# Patient Record
Sex: Male | Born: 1965 | Race: White | Hispanic: No | Smoking: Never smoker
Health system: Southern US, Community
[De-identification: ages and names within clinical notes are randomized; demographics above are authoritative.]

## PROBLEM LIST (undated history)

## (undated) DIAGNOSIS — N200 Calculus of kidney: Secondary | ICD-10-CM

## (undated) DIAGNOSIS — G4730 Sleep apnea, unspecified: Secondary | ICD-10-CM

## (undated) DIAGNOSIS — H9193 Unspecified hearing loss, bilateral: Secondary | ICD-10-CM

## (undated) HISTORY — DX: Unspecified hearing loss, bilateral: H91.93

---
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
## 2004-01-07 ENCOUNTER — Other Ambulatory Visit: Payer: Self-pay

## 2004-12-22 HISTORY — PX: CHOLECYSTECTOMY: SHX55

## 2005-02-05 ENCOUNTER — Ambulatory Visit: Payer: Self-pay | Admitting: General Surgery

## 2005-05-05 ENCOUNTER — Emergency Department: Payer: Self-pay | Admitting: General Surgery

## 2005-05-05 ENCOUNTER — Other Ambulatory Visit: Payer: Self-pay

## 2005-05-06 ENCOUNTER — Ambulatory Visit: Payer: Self-pay | Admitting: General Surgery

## 2005-08-03 ENCOUNTER — Emergency Department: Payer: Self-pay | Admitting: Emergency Medicine

## 2005-08-04 ENCOUNTER — Inpatient Hospital Stay: Payer: Self-pay | Admitting: Internal Medicine

## 2005-08-04 ENCOUNTER — Other Ambulatory Visit: Payer: Self-pay

## 2007-05-25 ENCOUNTER — Emergency Department: Payer: Self-pay | Admitting: Emergency Medicine

## 2010-04-29 ENCOUNTER — Ambulatory Visit: Payer: Self-pay | Admitting: General Practice

## 2015-09-28 ENCOUNTER — Telehealth: Payer: Self-pay | Admitting: Physician Assistant

## 2015-09-28 ENCOUNTER — Encounter: Payer: Self-pay | Admitting: Physician Assistant

## 2015-09-28 ENCOUNTER — Ambulatory Visit: Payer: Self-pay | Admitting: Physician Assistant

## 2015-09-28 VITALS — BP 130/80 | HR 55 | Temp 97.9°F | Wt 389.0 lb

## 2015-09-28 DIAGNOSIS — M10072 Idiopathic gout, left ankle and foot: Secondary | ICD-10-CM

## 2015-09-28 DIAGNOSIS — Z760 Encounter for issue of repeat prescription: Secondary | ICD-10-CM

## 2015-09-28 MED ORDER — PHENTERMINE HCL 37.5 MG PO CAPS: 37.5000 mg | ORAL_CAPSULE | ORAL | Status: DC

## 2015-09-28 NOTE — Telephone Encounter (Signed)
Seen by [place], weight check and med refill for phentermine approved by me

## 2015-09-28 NOTE — Progress Notes (Signed)
Subjective:     Patient ID: Scott Sanford, male   DOB: [birth year], 49 y.o.   MRN: [id number]  HPI Patient came in for medications refill. Macardis, Metropolol, Colchicine, and Indocin. Patient stataes Gout has flared up in left great toe.   Review of Systems Hypertension/Gerd, and Gout.     Objective:   Physical Exam No acute distress, overweight for height.  See vital signs. HEENT unremarkable, neck supple, Lungs CTA , and Heart RRR. Erythematou and edematous left great toe.     Assessment:     Hypertension, GERD, and Gout.     Plan:    Refill Macardis, Metropolol, Indocin and Colchicine.

## 2015-10-25 ENCOUNTER — Other Ambulatory Visit: Payer: Self-pay | Admitting: Emergency Medicine

## 2015-10-25 DIAGNOSIS — E559 Vitamin D deficiency, unspecified: Secondary | ICD-10-CM

## 2015-10-25 MED ORDER — VITAMIN D (ERGOCALCIFEROL) 1.25 MG (50000 UNIT) PO CAPS: 50000.0000 [IU] | ORAL_CAPSULE | ORAL | Status: DC

## 2015-10-25 NOTE — Telephone Encounter (Signed)
Med refill for vit d approved and sent to [facility] [place]

## 2015-10-25 NOTE — Telephone Encounter (Signed)
Received faxed [facility] from [facility] [place].  Please advise.  Thank you.

## 2015-11-08 ENCOUNTER — Encounter: Payer: Self-pay | Admitting: Physician Assistant

## 2015-11-08 ENCOUNTER — Ambulatory Visit: Payer: Self-pay | Admitting: Family

## 2015-11-08 MED ORDER — PHENTERMINE HCL 37.5 MG PO CAPS: 37.5000 mg | ORAL_CAPSULE | ORAL | Status: DC

## 2015-11-08 NOTE — Progress Notes (Signed)
S / f/u wt and appetite suppressant rx ,doing well, no cp ,sob, palpitations, constipation, insomnia Following calorie reduction diet and walking goal is 225  O/ wt is 369 down from 389  A morbid obesity  rx written for phentermine 37.5 mg one daily #30 o rf . F/u one month.praised and encouraged

## 2015-11-29 ENCOUNTER — Ambulatory Visit: Payer: Self-pay | Admitting: Physician Assistant

## 2015-11-29 ENCOUNTER — Encounter: Payer: Self-pay | Admitting: Physician Assistant

## 2015-11-29 VITALS — BP 120/70 | HR 69 | Temp 98.0°F

## 2015-11-29 DIAGNOSIS — J069 Acute upper respiratory infection, unspecified: Secondary | ICD-10-CM

## 2015-11-29 DIAGNOSIS — J018 Other acute sinusitis: Secondary | ICD-10-CM

## 2015-11-29 MED ORDER — FLUTICASONE PROPIONATE 50 MCG/ACT NA SUSP: 2.0000 | Freq: Every day | NASAL | Status: DC

## 2015-11-29 MED ORDER — CEFDINIR 300 MG PO CAPS: 300.0000 mg | ORAL_CAPSULE | Freq: Two times a day (BID) | ORAL | Status: DC

## 2015-11-29 NOTE — Progress Notes (Signed)
S: C/o runny nose and congestion for 3 days, sore throat,  no fever, chills, cp/sob, v/d; mucus was green this am but clear throughout the day, cough is sporadic, worried bc had bad case of pneumonia that started this way  O: PE: perrl eomi, normocephalic, tms dull, nasal mucosa red and swollen, throat injected, neck supple no lymph, lungs c t a, cv rrr, neuro intact  A:  Acute uri   P: omnicef 300mg  bid , flonase; drink fluids, continue regular meds , use otc meds of choice, return if not improving in 5 days, return earlier if worsening

## 2015-12-13 ENCOUNTER — Ambulatory Visit: Payer: Self-pay | Admitting: Physician Assistant

## 2015-12-13 MED ORDER — AMOXICILLIN 875 MG PO TABS: 875.0000 mg | ORAL_TABLET | Freq: Two times a day (BID) | ORAL | Status: DC

## 2015-12-13 NOTE — Progress Notes (Signed)
Pt called and states he did well on the omnicef and had cleared up but now the sx are returning, is concerned as is travelling to [place] in a few days Told him we could call in amoxil, f/u if not better after his trip

## 2015-12-13 NOTE — Addendum Note (Signed)
Addended by: Versie Starks on: 12/13/2015 08:55 AM   Modules accepted: Orders, Medications

## 2016-01-02 ENCOUNTER — Other Ambulatory Visit: Payer: Self-pay | Admitting: Physician Assistant

## 2016-01-02 MED ORDER — METHYLPREDNISOLONE 4 MG PO TBPK: ORAL_TABLET | ORAL | Status: DC

## 2016-01-02 NOTE — Progress Notes (Signed)
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

## 2016-03-20 ENCOUNTER — Other Ambulatory Visit: Payer: Self-pay | Admitting: Physician Assistant

## 2016-03-21 MED ORDER — METOPROLOL SUCCINATE ER 100 MG PO TB24: 100.0000 mg | ORAL_TABLET | Freq: Every day | ORAL | Status: DC

## 2016-03-21 MED ORDER — TELMISARTAN 80 MG PO TABS: 80.0000 mg | ORAL_TABLET | Freq: Every day | ORAL | Status: DC

## 2016-03-21 MED ORDER — OMEPRAZOLE 20 MG PO CPDR: 20.0000 mg | DELAYED_RELEASE_CAPSULE | Freq: Every day | ORAL | Status: DC

## 2016-03-21 NOTE — Telephone Encounter (Signed)
Med refill approved, pt states he is coming to clinic to have labs drawn within the next week

## 2016-08-28 ENCOUNTER — Encounter: Payer: Self-pay | Admitting: Physician Assistant

## 2016-08-28 ENCOUNTER — Ambulatory Visit: Payer: Self-pay | Admitting: Physician Assistant

## 2016-08-28 MED ORDER — PHENTERMINE HCL 37.5 MG PO CAPS: 37.5000 mg | ORAL_CAPSULE | ORAL | 0 refills | Status: DC

## 2016-08-28 NOTE — Progress Notes (Signed)
S: pt wants to go back on weight loss meds, states he has been off of it almost a year and has put a lot of his weight back on due to stress eating  O: weight 406lb, vitals wnl,   A: morbid obesity  P: phentermine 37.5mg  qd #30 nr

## 2016-09-26 ENCOUNTER — Ambulatory Visit: Payer: Self-pay | Admitting: Physician Assistant

## 2016-09-26 ENCOUNTER — Encounter: Payer: Self-pay | Admitting: Physician Assistant

## 2016-09-26 DIAGNOSIS — E559 Vitamin D deficiency, unspecified: Secondary | ICD-10-CM

## 2016-09-26 DIAGNOSIS — R7301 Impaired fasting glucose: Secondary | ICD-10-CM

## 2016-09-26 DIAGNOSIS — Z713 Dietary counseling and surveillance: Secondary | ICD-10-CM

## 2016-09-26 DIAGNOSIS — I10 Essential (primary) hypertension: Secondary | ICD-10-CM

## 2016-09-26 LAB — GLUCOSE, POCT (MANUAL RESULT ENTRY): POC Glucose: 120 mg/dl — AB (ref 70–99)

## 2016-09-26 MED ORDER — TELMISARTAN 80 MG PO TABS: 80.0000 mg | ORAL_TABLET | Freq: Every day | ORAL | 6 refills | Status: DC

## 2016-09-26 MED ORDER — ALLOPURINOL 300 MG PO TABS: 300.0000 mg | ORAL_TABLET | Freq: Every day | ORAL | 6 refills | Status: DC

## 2016-09-26 MED ORDER — OMEPRAZOLE 20 MG PO CPDR: 20.0000 mg | DELAYED_RELEASE_CAPSULE | Freq: Every day | ORAL | 6 refills | Status: DC

## 2016-09-26 MED ORDER — PHENTERMINE HCL 37.5 MG PO CAPS: 37.5000 mg | ORAL_CAPSULE | ORAL | 0 refills | Status: DC

## 2016-09-26 MED ORDER — VITAMIN D (ERGOCALCIFEROL) 1.25 MG (50000 UNIT) PO CAPS: 50000.0000 [IU] | ORAL_CAPSULE | ORAL | 12 refills | Status: DC

## 2016-09-26 MED ORDER — METOPROLOL SUCCINATE ER 100 MG PO TB24: 100.0000 mg | ORAL_TABLET | Freq: Every day | ORAL | 6 refills | Status: DC

## 2016-09-26 NOTE — Addendum Note (Signed)
Addended by: Versie Starks on: 09/26/2016 09:22 AM   Modules accepted: Orders

## 2016-09-26 NOTE — Progress Notes (Signed)
S: here for physical and med refills, no changes other than weight gain for which he started on phentermine again last month, states he noticed he urinated a lot the first few weeks when he started taking the medication, his knee pain got better with medication also, denies cough, congestion, cp/sob, abd pain, changes in bowel habits, blood in stool, night sweats Meds: see chart Allergies: nkda Former smoker, no etoh or drug abuse Fam Hx htn , diabetes  O: vitals wnl, nad, weight is 386.8 down from 406 last month, ENT wnl, neck supple no lymph, lungs c t a, cv rrr, abd soft nontender bs normal all 4 quads, rectal exam deferred by patient, ekg sinus bradycardia, fsbs 120  A: elevated glucose, htn, vit d deficiency, morbid obesity  P: refill on regular meds, rx for phentermine 37.5mg  written today, recheck in 1 month for weight loss counseling

## 2016-09-27 LAB — CMP12+LP+TP+TSH+6AC+PSA+CBC…
A/G RATIO: 2.2 (ref 1.2–2.2)
ALT: 65 IU/L — ABNORMAL HIGH (ref 0–44)
AST: 32 IU/L (ref 0–40)
Albumin: 4.4 g/dL (ref 3.5–5.5)
Alkaline Phosphatase: 122 IU/L — ABNORMAL HIGH (ref 39–117)
BASOS ABS: 0 10*3/uL (ref 0.0–0.2)
BUN/Creatinine Ratio: 15 (ref 9–20)
BUN: 14 mg/dL (ref 6–24)
Basos: 0 %
Bilirubin Total: 0.5 mg/dL (ref 0.0–1.2)
CHOL/HDL RATIO: 4.4 ratio (ref 0.0–5.0)
CHOLESTEROL TOTAL: 167 mg/dL (ref 100–199)
Calcium: 9.5 mg/dL (ref 8.7–10.2)
Chloride: 104 mmol/L (ref 96–106)
Creatinine, Ser: 0.95 mg/dL (ref 0.76–1.27)
EOS (ABSOLUTE): 0 10*3/uL (ref 0.0–0.4)
EOS: 1 %
Estimated CHD Risk: 0.9 times avg. (ref 0.0–1.0)
FREE THYROXINE INDEX: 2.6 (ref 1.2–4.9)
GFR calc Af Amer: 107 mL/min/{1.73_m2} (ref >59)
GFR calc non Af Amer: 92 mL/min/{1.73_m2} (ref >59)
GGT: 24 IU/L (ref 0–65)
GLOBULIN, TOTAL: 2 g/dL (ref 1.5–4.5)
Glucose: 114 mg/dL — ABNORMAL HIGH (ref 65–99)
HDL: 38 mg/dL — AB (ref >39)
HEMOGLOBIN: 15.4 g/dL (ref 12.6–17.7)
Hematocrit: 45.4 % (ref 37.5–51.0)
IMMATURE GRANS (ABS): 0 10*3/uL (ref 0.0–0.1)
IMMATURE GRANULOCYTES: 0 %
Iron: 58 ug/dL (ref 38–169)
LDH: 171 IU/L (ref 121–224)
LDL CALC: 108 mg/dL — AB (ref 0–99)
LYMPHS ABS: 1.4 10*3/uL (ref 0.7–3.1)
LYMPHS: 36 %
MCH: 31.4 pg (ref 26.6–33.0)
MCHC: 33.9 g/dL (ref 31.5–35.7)
MCV: 93 fL (ref 79–97)
MONOS ABS: 0.4 10*3/uL (ref 0.1–0.9)
Monocytes: 10 %
NEUTROS PCT: 53 %
Neutrophils Absolute: 2 10*3/uL (ref 1.4–7.0)
PHOSPHORUS: 2.9 mg/dL (ref 2.5–4.5)
PLATELETS: 150 10*3/uL (ref 150–379)
PROSTATE SPECIFIC AG, SERUM: 0.5 ng/mL (ref 0.0–4.0)
Potassium: 4.2 mmol/L (ref 3.5–5.2)
RBC: 4.91 x10E6/uL (ref 4.14–5.80)
RDW: 14.6 % (ref 12.3–15.4)
Sodium: 142 mmol/L (ref 134–144)
T3 Uptake Ratio: 31 % (ref 24–39)
T4, Total: 8.3 ug/dL (ref 4.5–12.0)
TOTAL PROTEIN: 6.4 g/dL (ref 6.0–8.5)
TRIGLYCERIDES: 104 mg/dL (ref 0–149)
TSH: 1.19 u[IU]/mL (ref 0.450–4.500)
Uric Acid: 4.5 mg/dL (ref 3.7–8.6)
VLDL CHOLESTEROL CAL: 21 mg/dL (ref 5–40)
WBC: 3.8 10*3/uL (ref 3.4–10.8)

## 2016-09-27 LAB — HEMOGLOBIN A1C
ESTIMATED AVERAGE GLUCOSE: 114 mg/dL
HEMOGLOBIN A1C: 5.6 % (ref 4.8–5.6)

## 2016-09-27 LAB — VITAMIN D 25 HYDROXY (VIT D DEFICIENCY, FRACTURES): Vit D, 25-Hydroxy: 43.1 ng/mL (ref 30.0–100.0)

## 2016-10-27 ENCOUNTER — Ambulatory Visit: Payer: Self-pay | Admitting: Physician Assistant

## 2016-10-27 VITALS — BP 140/80 | HR 65 | Temp 98.3°F | Wt 372.0 lb

## 2016-10-27 DIAGNOSIS — M67431 Ganglion, right wrist: Secondary | ICD-10-CM

## 2016-10-27 DIAGNOSIS — J029 Acute pharyngitis, unspecified: Secondary | ICD-10-CM

## 2016-10-27 MED ORDER — PHENTERMINE HCL 37.5 MG PO CAPS: 37.5000 mg | ORAL_CAPSULE | ORAL | 0 refills | Status: DC

## 2016-10-27 MED ORDER — AMOXICILLIN 875 MG PO TABS: 875.0000 mg | ORAL_TABLET | Freq: Two times a day (BID) | ORAL | 0 refills | Status: DC

## 2016-10-27 NOTE — Progress Notes (Signed)
S: here for weight check, has sore throat and knot on his wrist, no fever/chills, no known exposure to strep, no cough or congestion, knot on his wrist will come and go, is sore and makes his wrist hurt, no numbness or tingling, no known injury  O: vitals wnl, nad, skin intact, no redness, small knot on r wrist, mobile, nontender, tms clear, throat red, neck supple no lymph, lungs c t a, cv rrr, weight at 372, decreased from 386  A: ganglion cyst, morbid obesity, acute pharyngitis  P: wrist splint applied, phentermine refill, amoxil

## 2016-10-27 NOTE — Patient Instructions (Addendum)
Ganglion Cyst  A ganglion cyst is a noncancerous, fluid-filled lump that occurs near joints or tendons. The ganglion cyst grows out of a joint or the lining of a tendon. It most often develops in the hand or wrist, but it can also develop in the shoulder, elbow, hip, knee, ankle, or foot. The round or oval ganglion cyst can be the size of a pea or larger than a grape. Increased activity may enlarge the size of the cyst because more fluid starts to build up.   CAUSES  It is not known what causes a ganglion cyst to grow. However, it may be related to:  · Inflammation or irritation around the joint.  · An injury.  · Repetitive movements or overuse.  · Arthritis.  RISK FACTORS  Risk factors include:  · Being a woman.  · Being age 20-50.  SIGNS AND SYMPTOMS  Symptoms may include:   · A lump. This most often appears on the hand or wrist, but it can occur in other areas of the body.  · Tingling.  · Pain.  · Numbness.  · Muscle weakness.  · Weak grip.  · Less movement in a joint.  DIAGNOSIS  Ganglion cysts are most often diagnosed based on a physical exam. Your health care provider will feel the lump and may shine a light alongside it. If it is a ganglion cyst, a light often shines through it. Your health care provider may order an X-ray, ultrasound, or MRI to rule out other conditions.  TREATMENT  Ganglion cysts usually go away on their own without treatment. If pain or other symptoms are involved, treatment may be needed. Treatment is also needed if the ganglion cyst limits your movement or if it gets infected. Treatment may include:  · Wearing a brace or splint on your wrist or finger.  · Taking anti-inflammatory medicine.  · Draining fluid from the lump with a needle (aspiration).  · Injecting a steroid into the joint.  · Surgery to remove the ganglion cyst.  HOME CARE INSTRUCTIONS  · Do not press on the ganglion cyst, poke it with a needle, or hit it.  · Take medicines only as directed by your health care  provider.  · Wear your brace or splint as directed by your health care provider.  · Watch your ganglion cyst for any changes.  · Keep all follow-up visits as directed by your health care provider. This is important.  SEEK MEDICAL CARE IF:  · Your ganglion cyst becomes larger or more painful.  · You have increased redness, red streaks, or swelling.  · You have pus coming from the lump.  · You have weakness or numbness in the affected area.  · You have a fever or chills.     This information is not intended to replace advice given to you by your health care provider. Make sure you discuss any questions you have with your health care provider.     Document Released: 12/05/2000 Document Revised: 12/29/2014 Document Reviewed: 05/23/2014  Elsevier Interactive Patient Education ©2016 [employer]

## 2016-11-26 ENCOUNTER — Ambulatory Visit: Payer: Self-pay | Admitting: Physician Assistant

## 2016-11-26 ENCOUNTER — Encounter: Payer: Self-pay | Admitting: Physician Assistant

## 2016-11-26 VITALS — BP 144/80 | HR 67 | Temp 98.3°F | Wt 362.0 lb

## 2016-11-26 DIAGNOSIS — S0990XA Unspecified injury of head, initial encounter: Secondary | ICD-10-CM

## 2016-11-26 DIAGNOSIS — F329 Major depressive disorder, single episode, unspecified: Secondary | ICD-10-CM

## 2016-11-26 NOTE — Progress Notes (Signed)
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

## 2016-11-27 LAB — SPECIMEN STATUS

## 2016-11-28 LAB — B12 AND FOLATE PANEL
Folate: >20 ng/mL (ref >3.0)
Vitamin B-12: 695 pg/mL (ref 232–1245)

## 2016-11-28 LAB — TESTOSTERONE,FREE AND TOTAL
TESTOSTERONE FREE: 7.5 pg/mL (ref 7.2–24.0)
Testosterone: 748 ng/dL (ref 264–916)

## 2016-11-28 LAB — GLUCOSE, RANDOM: Glucose: 99 mg/dL (ref 65–99)

## 2016-12-03 ENCOUNTER — Encounter: Payer: Self-pay | Admitting: Emergency Medicine

## 2016-12-29 ENCOUNTER — Encounter: Payer: Self-pay | Admitting: Physician Assistant

## 2016-12-29 ENCOUNTER — Ambulatory Visit: Payer: Self-pay | Admitting: Physician Assistant

## 2016-12-29 VITALS — BP 141/85 | HR 71 | Temp 97.9°F | Wt 365.0 lb

## 2016-12-29 DIAGNOSIS — Z713 Dietary counseling and surveillance: Secondary | ICD-10-CM

## 2016-12-29 DIAGNOSIS — R55 Syncope and collapse: Secondary | ICD-10-CM

## 2016-12-29 MED ORDER — PHENTERMINE HCL 37.5 MG PO CAPS: 37.5000 mg | ORAL_CAPSULE | ORAL | 0 refills | Status: DC

## 2016-12-29 NOTE — Progress Notes (Signed)
S: c/o an episode of dizziness associated with syncope while on his hunting trip, thinks its from the motion sickness he gets, was looking through binoculars and got really dizzy, woke up on the floor, was hot and sweaty, no cp/sob, no fatigue afterwards, took bromine and felt better, states after taking the motion sickness med he didn't have anymore problems, no headache  O: vitals wnl nad, perrl eomi, tms clear, neck supple no lymph, lungs c t a, cv rrr, ekg wnl, no changes from last ekg, weight is up 3lbs   A: vertigo with syncope, weight loss consult  P: refer to ENT for eval of vertigo, will refer to neuro if needed, phentermine 37.5 mg qd

## 2017-01-01 NOTE — Progress Notes (Signed)
Patient was referred to see Dr. Tami Ribas at [facility] on 01/13/17 @ 2:15.  Patient has been notified of his appointment.

## 2017-01-30 ENCOUNTER — Ambulatory Visit: Payer: Self-pay | Admitting: Physician Assistant

## 2017-02-04 DIAGNOSIS — R42 Dizziness and giddiness: Secondary | ICD-10-CM | POA: Insufficient documentation

## 2017-02-04 DIAGNOSIS — G4733 Obstructive sleep apnea (adult) (pediatric): Secondary | ICD-10-CM | POA: Insufficient documentation

## 2017-02-04 DIAGNOSIS — Z8659 Personal history of other mental and behavioral disorders: Secondary | ICD-10-CM | POA: Insufficient documentation

## 2017-02-04 DIAGNOSIS — Z6841 Body Mass Index (BMI) 40.0 and over, adult: Secondary | ICD-10-CM

## 2017-02-04 DIAGNOSIS — R413 Other amnesia: Secondary | ICD-10-CM | POA: Insufficient documentation

## 2017-02-09 ENCOUNTER — Other Ambulatory Visit: Payer: Self-pay | Admitting: Nurse Practitioner

## 2017-02-09 DIAGNOSIS — R413 Other amnesia: Secondary | ICD-10-CM

## 2017-02-09 DIAGNOSIS — R42 Dizziness and giddiness: Secondary | ICD-10-CM

## 2017-02-20 ENCOUNTER — Ambulatory Visit
Admission: RE | Admit: 2017-02-20 | Discharge: 2017-02-20 | Disposition: A | Discharge status: Home / Self Care | Payer: Managed Care, Other (non HMO) | Source: Ambulatory Visit | Attending: Nurse Practitioner | Admitting: Nurse Practitioner

## 2017-02-20 ENCOUNTER — Ambulatory Visit: Payer: Self-pay | Admitting: Physician Assistant

## 2017-02-20 DIAGNOSIS — R42 Dizziness and giddiness: Secondary | ICD-10-CM | POA: Diagnosis not present

## 2017-02-20 DIAGNOSIS — R413 Other amnesia: Secondary | ICD-10-CM

## 2017-02-20 LAB — POCT I-STAT CREATININE: CREATININE: 0.9 mg/dL (ref 0.61–1.24)

## 2017-02-20 MED ORDER — GADOBENATE DIMEGLUMINE 529 MG/ML IV SOLN
20.0000 mL | Freq: Once | INTRAVENOUS | Status: AC | PRN
  Administered 2017-02-20: 20 mL via INTRAVENOUS

## 2017-03-06 ENCOUNTER — Encounter: Payer: Self-pay | Admitting: Physician Assistant

## 2017-03-06 ENCOUNTER — Ambulatory Visit: Payer: Self-pay | Admitting: Physician Assistant

## 2017-03-06 VITALS — BP 130/80 | HR 64 | Temp 98.1°F | Wt 365.0 lb

## 2017-03-06 DIAGNOSIS — Z0189 Encounter for other specified special examinations: Principal | ICD-10-CM

## 2017-03-06 DIAGNOSIS — Z008 Encounter for other general examination: Secondary | ICD-10-CM

## 2017-03-06 MED ORDER — PHENTERMINE HCL 37.5 MG PO CAPS: 37.5000 mg | ORAL_CAPSULE | ORAL | 0 refills | Status: DC

## 2017-03-06 NOTE — Progress Notes (Signed)
S: here for weight loss consult, hasn't had his meds in 2 months, has been to the neurologist, had neg mri, was told memory loss probably due to stress and sleep apnea, needs 7-8 hours of sleep to decrease memory loss, no complaints today, had physical back in October with fasting labs, ?if we could fill out biometric form today  O: vitals wnl, nad, lungs c t a, cv rrr, weight is same as Jan  A: morbid obesity, biometric  P: phentermine 37.5mg  qd #30 nr, recheck in 1 month, will add physical to spreadsheet

## 2017-03-23 ENCOUNTER — Other Ambulatory Visit: Payer: Self-pay | Admitting: Physician Assistant

## 2017-03-23 DIAGNOSIS — J069 Acute upper respiratory infection, unspecified: Secondary | ICD-10-CM

## 2017-03-23 NOTE — Telephone Encounter (Signed)
Med refill for flonase approved

## 2017-04-06 ENCOUNTER — Ambulatory Visit: Payer: Self-pay | Admitting: Physician Assistant

## 2017-04-06 ENCOUNTER — Encounter: Payer: Self-pay | Admitting: Physician Assistant

## 2017-04-06 MED ORDER — PHENTERMINE HCL 37.5 MG PO CAPS: 37.5000 mg | ORAL_CAPSULE | ORAL | 0 refills | Status: DC

## 2017-04-06 NOTE — Progress Notes (Signed)
S: here for weight check, states he fell off of the wagon for the first 2 weeks, now is trying to get back on schedule, states he knows he lost weight in the last 2 weeks bc his clothes are looser, says the stress at work is just "eating him alive"; denies cp/sob, did see the neurologist and everything was ok with his mri, they think the memory loss is from stress and sleep apnea, is to take melatonin nightly  O vitals wnl, nad, weight is the same as last month, lungs c t a, cv rrr  A: morbid obesity  P: modify the way he handles stress, overeating due to stress is an eating disorder, pt states he understands and will try harder this month, rx for phentermine #30 nr

## 2017-05-06 ENCOUNTER — Ambulatory Visit: Payer: Self-pay | Admitting: Physician Assistant

## 2017-05-06 DIAGNOSIS — I10 Essential (primary) hypertension: Secondary | ICD-10-CM

## 2017-05-06 DIAGNOSIS — K219 Gastro-esophageal reflux disease without esophagitis: Secondary | ICD-10-CM

## 2017-05-06 MED ORDER — TELMISARTAN 80 MG PO TABS: 80.0000 mg | ORAL_TABLET | Freq: Every day | ORAL | 2 refills | Status: DC

## 2017-05-06 MED ORDER — ALLOPURINOL 300 MG PO TABS: 300.0000 mg | ORAL_TABLET | Freq: Every day | ORAL | 2 refills | Status: DC

## 2017-05-06 MED ORDER — OMEPRAZOLE 20 MG PO CPDR: 20.0000 mg | DELAYED_RELEASE_CAPSULE | Freq: Every day | ORAL | 2 refills | Status: DC

## 2017-05-06 MED ORDER — PHENTERMINE HCL 37.5 MG PO CAPS: 37.5000 mg | ORAL_CAPSULE | ORAL | 0 refills | Status: DC

## 2017-05-06 MED ORDER — METOPROLOL SUCCINATE ER 100 MG PO TB24: 100.0000 mg | ORAL_TABLET | Freq: Every day | ORAL | 2 refills | Status: DC

## 2017-05-06 NOTE — Progress Notes (Signed)
S: here for weight loss consult, lost 10lb last month, has better control of his binge eating while on medication, also started walking, got blisters on his heels so it slowed him down, no cp/sob, needs med refills on maintenance medications  O: vitals wnl, nad, lungs c t a, cv rrr, neuro intact  A: morbid obesity with weight loss consult, htn, gerd  P: med refills sent to pharmacy for allopurinol, omeprazole, micardis, toprol xl, rx for phentermine 37.76mb qd #30 nr, recheck in 1 month

## 2017-06-08 ENCOUNTER — Ambulatory Visit: Payer: Self-pay | Admitting: Physician Assistant

## 2017-06-19 ENCOUNTER — Encounter: Payer: Self-pay | Admitting: Physician Assistant

## 2017-06-19 ENCOUNTER — Ambulatory Visit: Payer: Self-pay | Admitting: Physician Assistant

## 2017-06-19 MED ORDER — PHENTERMINE HCL 37.5 MG PO CAPS: 37.5000 mg | ORAL_CAPSULE | ORAL | 0 refills | Status: DC

## 2017-06-19 NOTE — Progress Notes (Signed)
S: here for med refill on phentermine. States he has been out of the medication for about 3 weeks, has been walking every other day, his clothes are becoming loose but he is surprised the scale didn't go down, no problems with the medication  O: vitals wnl, nad, lungs c t a, cv rrr, weight is same as last time  A: morbid obesity  P: concentrate on healthy eating, continue to walk and lift weights, as long as the clothing is becoming bigger I am ok with continuing the medication

## 2017-07-12 ENCOUNTER — Ambulatory Visit
Admission: EM | Admit: 2017-07-12 | Discharge: 2017-07-12 | Disposition: A | Discharge status: Home / Self Care | Payer: Managed Care, Other (non HMO) | Source: Home / Self Care | Attending: Emergency Medicine | Admitting: Emergency Medicine

## 2017-07-12 ENCOUNTER — Encounter: Payer: Self-pay | Admitting: *Deleted

## 2017-07-12 ENCOUNTER — Emergency Department: Payer: Managed Care, Other (non HMO)

## 2017-07-12 ENCOUNTER — Emergency Department
Admission: EM | Admit: 2017-07-12 | Discharge: 2017-07-12 | Disposition: A | Discharge status: Home / Self Care | Payer: Managed Care, Other (non HMO) | Attending: Emergency Medicine | Admitting: Emergency Medicine

## 2017-07-12 DIAGNOSIS — I10 Essential (primary) hypertension: Secondary | ICD-10-CM | POA: Insufficient documentation

## 2017-07-12 DIAGNOSIS — Z87891 Personal history of nicotine dependence: Secondary | ICD-10-CM | POA: Diagnosis not present

## 2017-07-12 DIAGNOSIS — M545 Low back pain, unspecified: Secondary | ICD-10-CM

## 2017-07-12 DIAGNOSIS — N200 Calculus of kidney: Secondary | ICD-10-CM | POA: Diagnosis not present

## 2017-07-12 DIAGNOSIS — R109 Unspecified abdominal pain: Secondary | ICD-10-CM | POA: Diagnosis present

## 2017-07-12 DIAGNOSIS — R319 Hematuria, unspecified: Secondary | ICD-10-CM | POA: Diagnosis not present

## 2017-07-12 HISTORY — DX: Sleep apnea, unspecified: G47.30

## 2017-07-12 LAB — URINALYSIS, COMPLETE (UACMP) WITH MICROSCOPIC
BACTERIA UA: NONE SEEN
GLUCOSE, UA: NEGATIVE mg/dL
Leukocytes, UA: NEGATIVE
Nitrite: NEGATIVE
PROTEIN: 30 mg/dL — AB
SQUAMOUS EPITHELIAL / LPF: NONE SEEN
Specific Gravity, Urine: >1.03 — ABNORMAL HIGH (ref 1.005–1.030)
WBC UA: NONE SEEN WBC/hpf (ref 0–5)
pH: 5.5 (ref 5.0–8.0)

## 2017-07-12 LAB — CBC
HCT: 48.8 % (ref 40.0–52.0)
HEMOGLOBIN: 16.5 g/dL (ref 13.0–18.0)
MCH: 32.2 pg (ref 26.0–34.0)
MCHC: 33.9 g/dL (ref 32.0–36.0)
MCV: 94.8 fL (ref 80.0–100.0)
PLATELETS: 138 10*3/uL — AB (ref 150–440)
RBC: 5.14 MIL/uL (ref 4.40–5.90)
RDW: 14.3 % (ref 11.5–14.5)
WBC: 7.4 10*3/uL (ref 3.8–10.6)

## 2017-07-12 LAB — COMPREHENSIVE METABOLIC PANEL
ALBUMIN: 4.4 g/dL (ref 3.5–5.0)
ALK PHOS: 101 U/L (ref 38–126)
ALT: 30 U/L (ref 17–63)
ANION GAP: 8 (ref 5–15)
AST: 26 U/L (ref 15–41)
BILIRUBIN TOTAL: 0.8 mg/dL (ref 0.3–1.2)
BUN: 19 mg/dL (ref 6–20)
CALCIUM: 9.4 mg/dL (ref 8.9–10.3)
CO2: 26 mmol/L (ref 22–32)
CREATININE: 1.16 mg/dL (ref 0.61–1.24)
Chloride: 107 mmol/L (ref 101–111)
GFR calc Af Amer: >60 mL/min (ref >60)
GFR calc non Af Amer: >60 mL/min (ref >60)
GLUCOSE: 141 mg/dL — AB (ref 65–99)
Potassium: 3.7 mmol/L (ref 3.5–5.1)
Sodium: 141 mmol/L (ref 135–145)
TOTAL PROTEIN: 7.3 g/dL (ref 6.5–8.1)

## 2017-07-12 MED ORDER — NAPROXEN 500 MG PO TABS: 500.0000 mg | ORAL_TABLET | Freq: Two times a day (BID) | ORAL | 2 refills | Status: DC

## 2017-07-12 MED ORDER — OXYCODONE-ACETAMINOPHEN 5-325 MG PO TABS: 1.0000 | ORAL_TABLET | Freq: Four times a day (QID) | ORAL | 0 refills | Status: DC | PRN

## 2017-07-12 MED ORDER — ACETAMINOPHEN 500 MG PO TABS
1000.0000 mg | ORAL_TABLET | Freq: Once | ORAL | Status: AC
  Administered 2017-07-12: 1000 mg via ORAL

## 2017-07-12 MED ORDER — TAMSULOSIN HCL 0.4 MG PO CAPS: 0.4000 mg | ORAL_CAPSULE | Freq: Every day | ORAL | 0 refills | Status: DC

## 2017-07-12 MED ORDER — KETOROLAC TROMETHAMINE 30 MG/ML IJ SOLN
30.0000 mg | Freq: Once | INTRAMUSCULAR | Status: AC
  Administered 2017-07-12: 30 mg via INTRAMUSCULAR
  Filled 2017-07-12: qty 1

## 2017-07-12 MED ORDER — KETOROLAC TROMETHAMINE 60 MG/2ML IM SOLN
30.0000 mg | Freq: Once | INTRAMUSCULAR | Status: AC
  Administered 2017-07-12: 30 mg via INTRAMUSCULAR

## 2017-07-12 MED ORDER — ONDANSETRON 4 MG PO TBDP: 4.0000 mg | ORAL_TABLET | Freq: Three times a day (TID) | ORAL | 0 refills | Status: DC | PRN

## 2017-07-12 NOTE — ED Provider Notes (Signed)
HPI  SUBJECTIVE:  Scott Sanford is a 51 y.o. male who presents with right-sided low back pain radiating to his right lower quadrant over the past 4 days. States it was intermittent initially, but has become constant today at 0600. Describes pain as burning, sharp, stabbing. He reports anorexia, nausea and odorous, and dark urine. There are no aggravating or alleviating factors. He has not tried anything for this. His back pain is not associated with walking, movement, bending forward, torso rotation. No fevers, vomiting. No dysuria, urgency, frequency or gross hematuria. No testicular pain, flank pain, saddle anesthesia, extremity weakness, numbness or tingling in his legs. No change in his physical activity, trauma to the back, heavy lifting. States that the car right over here was not painful. No urinary fecal incontinence, urinary retention. No abdominal distention. No rash. He has never had symptoms like this before. He has a past medical history of prostatitis, status post cholecystectomy and morbid obesity. No history of nephrolithiasis, UTI, pyelonephritis, diabetes. Family history significant for brother with nephrolithiasis. PMD: Dr. Ashok Cordia.   Past Medical History:  Diagnosis Date  . Hypertension     Past Surgical History:  Procedure Laterality Date  . CHOLECYSTECTOMY    . TONSILLECTOMY      History reviewed. No pertinent family history.  Social History  Substance Use Topics  . Smoking status: Former Research scientist (life sciences)  . Smokeless tobacco: Former Systems developer  . Alcohol use No     Current Facility-Administered Medications:  .  acetaminophen (TYLENOL) tablet 1,000 mg, 1,000 mg, Oral, Once, Melynda Ripple, MD  Current Outpatient Prescriptions:  .  allopurinol (ZYLOPRIM) 300 MG tablet, Take 1 tablet (300 mg total) by mouth daily., Disp: 90 tablet, Rfl: 2 .  fluticasone (FLONASE) 50 MCG/ACT nasal spray, PLACE 2 SPRAYS INTO BOTH NOSTRILS DAILY., Disp: 16 g, Rfl: 12 .  metoprolol succinate  (TOPROL-XL) 100 MG 24 hr tablet, Take 1 tablet (100 mg total) by mouth at bedtime., Disp: 90 tablet, Rfl: 2 .  omeprazole (PRILOSEC) 20 MG capsule, Take 1 capsule (20 mg total) by mouth daily., Disp: 90 capsule, Rfl: 2 .  phentermine 37.5 MG capsule, Take 1 capsule (37.5 mg total) by mouth every morning., Disp: 30 capsule, Rfl: 0 .  telmisartan (MICARDIS) 80 MG tablet, Take 1 tablet (80 mg total) by mouth at bedtime., Disp: 90 tablet, Rfl: 2 .  Vitamin D, Ergocalciferol, (DRISDOL) 50000 units CAPS capsule, Take 1 capsule (50,000 Units total) by mouth once a week., Disp: 30 capsule, Rfl: 12 .  DESONATE 9.56 % gel, 1 APPLICATION APPLY ON THE SKIN DAILY APPLY TO FACE FOR 10 DAYS., Disp: , Rfl: 3  Allergies  Allergen Reactions  . Lisinopril-Hydrochlorothiazide     Other reaction(s): Dizziness     ROS  As noted in HPI.   Physical Exam  BP 135/67 (BP Location: Right Arm)   Pulse 60   Temp (!) 97.5 F (36.4 C) (Oral)   Resp 20   Ht 5\' 11"  (1.803 m)   Wt (!) 355 lb (161 kg)   SpO2 100%   BMI 49.51 kg/m   Constitutional: Well developed, well nourished, In moderate to severe painful distress .Moving around, cannot find comfortable position. Diaphoretic. Morbidly obese.  Eyes:  EOMI, conjunctiva normal bilaterally HENT: Normocephalic, atraumatic,mucus membranes moist Respiratory: Normal inspiratory effort Cardiovascular: Normal rate GI: nondistended. No suprapubic tenderness, No flank tenderness, no other tenderness over his abdomen. States that pressing on his bladder and along the right flank makes his back  pain worse. No RLQ tenderness. Negative Murphy. No guarding, rebound. skin: No rash, skin intact Musculoskeletal: Right sided  CVAT. + Right-sided  paralumbar tenderness, - muscle spasm. No bony tenderness. Bilateral lower extremities nontender, baseline ROM with intact PT pulses. No pain with int/ext rotation flex/extension hips bilaterally. SLR neg bilaterally. Sensation baseline  light touch bilaterally for Pt, DTR's symmetric and intact bilaterally KJ, Motor symmetric bilateral 5/5 hip flexion, quadriceps, hamstrings, EHL, foot dorsiflexion, foot plantarflexion, gait somewhat antalgic but without apparent new ataxia. Neurologic: Alert & oriented x 3, no focal neuro deficits Psychiatric: Speech and behavior appropriate   ED Course   Medications  acetaminophen (TYLENOL) tablet 1,000 mg (not administered)  ketorolac (TORADOL) injection 30 mg (30 mg Intramuscular Given 07/12/17 1120)    Orders Placed This Encounter  Procedures  . Urinalysis, Complete w Microscopic    Standing Status:   Standing    Number of Occurrences:   1    Results for orders placed or performed during the hospital encounter of 07/12/17 (from the past 24 hour(s))  Urinalysis, Complete w Microscopic     Status: Abnormal   Collection Time: 07/12/17 10:56 AM  Result Value Ref Range   Color, Urine YELLOW YELLOW   APPearance CLOUDY (A) CLEAR   Specific Gravity, Urine >1.030 (H) 1.005 - 1.030   pH 5.5 5.0 - 8.0   Glucose, UA NEGATIVE NEGATIVE mg/dL   Hgb urine dipstick LARGE (A) NEGATIVE   Bilirubin Urine SMALL (A) NEGATIVE   Ketones, ur TRACE (A) NEGATIVE mg/dL   Protein, ur 30 (A) NEGATIVE mg/dL   Nitrite NEGATIVE NEGATIVE   Leukocytes, UA NEGATIVE NEGATIVE   Squamous Epithelial / LPF NONE SEEN NONE SEEN   WBC, UA NONE SEEN 0 - 5 WBC/hpf   RBC / HPF TOO NUMEROUS TO COUNT 0 - 5 RBC/hpf   Bacteria, UA NONE SEEN NONE SEEN   Mucous PRESENT    No results found.  ED Clinical Impression  Hematuria, unspecified type  Acute right-sided low back pain without sciatica   ED Assessment/Plan  [place] narcotic database reviewed. Pt with no opiate rx In the past 6 months.   Care everywhere records reviewed. No mention of back pain.  Gave Toradol 30 mg IM and 1 g of Tylenol without any symptom relief  It does not appear to be appendicitis, although this is in the differential. Given the  hematuria, suspect nephrolithiasis. Since he has not had any documentation nephrolithiasis previously, plan to send to the ER for pain control and comprehensive workup to r/o obstructing stone Versus other intra-abdominal process. Patient has decided to go to [facility] emergency department. Feel the patient is stable to go by private vehicle. Notified Levada Dy, triage nurse. Discussed labs, rationale for transfer to the ED. Patient agrees with plan.    Meds ordered this encounter  Medications  . ketorolac (TORADOL) injection 30 mg  . acetaminophen (TYLENOL) tablet 1,000 mg    *This clinic note was created using [employer]. Therefore, there may be occasional mistakes despite careful proofreading.  ?    Melynda Ripple, MD 07/12/17 1141

## 2017-07-12 NOTE — ED Triage Notes (Signed)
Patient c/o right flank pain and was seen at [facility] today. Patient states his urine specimen showed hematuria and was given Ketorolac IM at 11:20 which has lessened his pain.

## 2017-07-12 NOTE — ED Provider Notes (Signed)
[facility] Emergency Department Provider Note   ____________________________________________    I have reviewed the triage vital signs and the nursing notes.   HISTORY  Chief Complaint Flank Pain     HPI Scott Sanford is a 51 y.o. male presents with complaints of right flank pain. Patient reports patient notes pain began approximately 2 days ago and has been intermittently quite severe and sharp in nature. He went to urgent care today they found blood in his urine and referred him to the ED for further evaluation. No history of kidney stones. He has had nausea but no vomiting. He has had a cholecystectomy. No fevers or chills or dysuria   Past Medical History:  Diagnosis Date  . Hypertension   . Sleep apnea     Patient Active Problem List   Diagnosis Date Noted  . Dizziness 02/04/2017  . History of depression 02/04/2017  . Loss of memory 02/04/2017  . OSA on CPAP 02/04/2017  . Class 3 severe obesity due to excess calories with serious comorbidity and body mass index (BMI) of 50.0 to 59.9 in adult [facility]) 02/04/2017    Past Surgical History:  Procedure Laterality Date  . CHOLECYSTECTOMY    . TONSILLECTOMY      Prior to Admission medications   Medication Sig Start Date End Date Taking? Authorizing Provider  allopurinol (ZYLOPRIM) 300 MG tablet Take 1 tablet (300 mg total) by mouth daily. 05/06/17  Yes Fisher, Linden Dolin, PA-C  fluticasone [facility]) 50 MCG/ACT nasal spray PLACE 2 SPRAYS INTO BOTH NOSTRILS DAILY. 03/23/17  Yes Fisher, Linden Dolin, PA-C  metoprolol succinate (TOPROL-XL) 100 MG 24 hr tablet Take 1 tablet (100 mg total) by mouth at bedtime. 05/06/17  Yes Fisher, Linden Dolin, PA-C  omeprazole (PRILOSEC) 20 MG capsule Take 1 capsule (20 mg total) by mouth daily. 05/06/17  Yes Versie Starks, PA-C  phentermine 37.5 MG capsule Take 1 capsule (37.5 mg total) by mouth every morning. 06/19/17  Yes Fisher, Linden Dolin, PA-C  telmisartan (MICARDIS) 80 MG  tablet Take 1 tablet (80 mg total) by mouth at bedtime. 05/06/17  Yes Fisher, Linden Dolin, PA-C  Vitamin D, Ergocalciferol, (DRISDOL) 50000 units CAPS capsule Take 1 capsule (50,000 Units total) by mouth once a week. 09/26/16  Yes Fisher, Linden Dolin, PA-C  naproxen (NAPROSYN) 500 MG tablet Take 1 tablet (500 mg total) by mouth 2 (two) times daily with a meal. 07/12/17   Lavonia Drafts, MD  ondansetron (ZOFRAN ODT) 4 MG disintegrating tablet Take 1 tablet (4 mg total) by mouth every 8 (eight) hours as needed for nausea or vomiting. 07/12/17   Lavonia Drafts, MD  oxyCODONE-acetaminophen (ROXICET) 5-325 MG tablet Take 1 tablet by mouth every 6 (six) hours as needed. 07/12/17 07/12/18  Lavonia Drafts, MD  tamsulosin (FLOMAX) 0.4 MG CAPS capsule Take 1 capsule (0.4 mg total) by mouth daily. 07/12/17   Lavonia Drafts, MD     Allergies Lisinopril-hydrochlorothiazide  No family history on file.  Social History Social History  Substance Use Topics  . Smoking status: Former Research scientist (life sciences)  . Smokeless tobacco: Former Systems developer  . Alcohol use 0.0 oz/week     Comment: occasionally    Review of Systems  Constitutional: No fever/chills Eyes: No visual changes.  ENT: No sore throat. Cardiovascular: Denies chest pain. Respiratory: Denies shortness of breath. Gastrointestinal: As above Genitourinary: Negative for dysuria. Musculoskeletal: Negative for back pain. Skin: Negative for rash. Neurological: Negative for headaches   ____________________________________________  PHYSICAL EXAM:  VITAL SIGNS: ED Triage Vitals  Enc Vitals Group     BP 07/12/17 1223 132/67     Pulse Rate 07/12/17 1223 (!) 50     Resp 07/12/17 1223 18     Temp 07/12/17 1223 97.6 F (36.4 C)     Temp Source 07/12/17 1223 Oral     SpO2 07/12/17 1223 99 %     Weight 07/12/17 1227 (!) 161 kg (355 lb)     Height 07/12/17 1227 1.803 m (5\' 11" )     Head Circumference --      Peak Flow --      Pain Score 07/12/17 1223 4     Pain Loc --       Pain Edu? --      Excl. in [place]? --     Constitutional: Alert and oriented. No acute distress. Pleasant and interactive  Nose: No congestion/rhinnorhea. Mouth/Throat: Mucous membranes are moist.    Cardiovascular: Normal rate, regular rhythm.  Good peripheral circulation. Respiratory: Normal respiratory effort.  No retractions.  Gastrointestinal: Soft and nontender. No distention.  No CVA tenderness. Genitourinary: deferred Musculoskeletal: .  Warm and well perfused Neurologic:  Normal speech and language. No gross focal neurologic deficits are appreciated.  Skin:  Skin is warm, dry and intact. No rash noted. Psychiatric: Mood and affect are normal. Speech and behavior are normal.  ____________________________________________   LABS (all labs ordered are listed, but only abnormal results are displayed)  Labs Reviewed  COMPREHENSIVE METABOLIC PANEL - Abnormal; Notable for the following:       Result Value   Glucose, Bld 141 (*)    All other components within normal limits  CBC - Abnormal; Notable for the following:    Platelets 138 (*)    All other components within normal limits   ____________________________________________  EKG  None ____________________________________________  RADIOLOGY  CT renal stone study demonstrates right 1 mm UVJ stone ____________________________________________   PROCEDURES  Procedure(s) performed: No    Critical Care performed: No ____________________________________________   INITIAL IMPRESSION / ASSESSMENT AND PLAN / ED COURSE  Pertinent labs & imaging results that were available during my care of the patient were reviewed by me and considered in my medical decision making (see chart for details).  Patient presents with right flank pain of acute onset, intermittent severe pain most consistent with kidney stone. CT scan confirms 1 mm UVJ stone, patient's pain is well-controlled after Toradol. Vitals are normal. Reviewed  urinalysis at urgent care note evidence of infection. Lab work overall reassuring. We'll discharge with analgesics. Urology follow-up    ____________________________________________   FINAL CLINICAL IMPRESSION(S) / ED DIAGNOSES  Final diagnoses:  Kidney stone      NEW MEDICATIONS STARTED DURING THIS VISIT:  Discharge Medication List as of 07/12/2017  4:05 PM    START taking these medications   Details  naproxen (NAPROSYN) 500 MG tablet Take 1 tablet (500 mg total) by mouth 2 (two) times daily with a meal., Starting Sun 07/12/2017, Print    ondansetron (ZOFRAN ODT) 4 MG disintegrating tablet Take 1 tablet (4 mg total) by mouth every 8 (eight) hours as needed for nausea or vomiting., Starting Sun 07/12/2017, Print    oxyCODONE-acetaminophen (ROXICET) 5-325 MG tablet Take 1 tablet by mouth every 6 (six) hours as needed., Starting Sun 07/12/2017, Until Mon 07/12/2018, Print    tamsulosin (FLOMAX) 0.4 MG CAPS capsule Take 1 capsule (0.4 mg total) by mouth daily., Starting Sun 07/12/2017, Print  Note:  This document was prepared using Dragon voice recognition software and may include unintentional dictation errors.    Lavonia Drafts, MD 07/12/17 [phone]

## 2017-07-12 NOTE — Discharge Instructions (Signed)
Left them know if your back pain changes, gets worse. Do not have anything further to eat or drink until you evaluated by the emergency department.

## 2017-07-12 NOTE — ED Triage Notes (Signed)
51 year old Caucasian male is here with his brother with complaints of Right lower abdomen and back pain that started Thursday. He states he has had a cholecystectomy but no appendectomy. He states the pain is now shooting from his back to his stomach.

## 2017-07-17 ENCOUNTER — Encounter: Payer: Self-pay | Admitting: Physician Assistant

## 2017-07-17 ENCOUNTER — Ambulatory Visit: Payer: Self-pay | Admitting: Physician Assistant

## 2017-07-17 VITALS — BP 130/80 | HR 55 | Wt 366.0 lb

## 2017-07-17 DIAGNOSIS — N200 Calculus of kidney: Secondary | ICD-10-CM

## 2017-07-17 DIAGNOSIS — N410 Acute prostatitis: Secondary | ICD-10-CM

## 2017-07-17 MED ORDER — CIPROFLOXACIN HCL 500 MG PO TABS: 500.0000 mg | ORAL_TABLET | Freq: Two times a day (BID) | ORAL | 0 refills | Status: DC

## 2017-07-17 MED ORDER — PHENTERMINE HCL 37.5 MG PO CAPS: 37.5000 mg | ORAL_CAPSULE | ORAL | 0 refills | Status: DC

## 2017-07-17 MED ORDER — KETOROLAC TROMETHAMINE 10 MG PO TABS
10.0000 mg | ORAL_TABLET | Freq: Four times a day (QID) | ORAL | 0 refills | Status: DC | PRN
Start: 2017-07-17 — End: 2017-08-21

## 2017-07-17 NOTE — Progress Notes (Signed)
S: 1.pt recently seen in the ER for kidney stone, states he has several in the kidney, the naproxen he was given has made him retain fluid and feel nauseated, quit taking it a couple of days ago and the fluid is decreasing, not taking the narcotic,  2. also thinks he has a prostate infection, has been sitting on a hard bench for long periods of time, hx of same and feels the same, urine did have odor to it last week but not since he passed the kidney stone 3. Weight check, states he hasn't been able to eat right, has a lot of things going on at home and work along with the fluid retention from the naproxen 4. Sleep apnea machine needs to be evaluated, can he just take his card over to the sleep study place 5. His heart rate was low when in the ER, states he has been walking to lose wieght and ?if this could decrease his hr, is on telmisartan and toprol xl 100mg , denies cp/sob  O: vitals bp 130/80, pulse 54-55, afebrile, lungs c t a, cv rrr, no cva tenderness, prostate exam deferred by pt  A: f/u kidney stone, prostatitis, morbid obesity, sleep apnea, bradycardia  P: refer to urology pt prefers a male; concentrate on eating right, hold off on phentermine for now start in a few weeks when everything else settles down, cut bp med in 1/2 and recheck next tuesday

## 2017-07-21 ENCOUNTER — Encounter: Payer: Self-pay | Admitting: Physician Assistant

## 2017-07-21 ENCOUNTER — Ambulatory Visit: Payer: Self-pay | Admitting: Physician Assistant

## 2017-07-21 VITALS — BP 110/70 | HR 58 | Temp 98.5°F | Resp 16

## 2017-07-21 DIAGNOSIS — I10 Essential (primary) hypertension: Secondary | ICD-10-CM

## 2017-07-21 NOTE — Addendum Note (Signed)
Addended by: Rudene Anda T on: 07/21/2017 01:52 PM   Modules accepted: Orders

## 2017-07-21 NOTE — Progress Notes (Signed)
S: here for bp check due to low hr we had cut the dose of toprol in 1/2, states his pulse is staying around 60, and bp has been good, urologist told him he would pass the stones and they are probably made of uric acid  O: vitals wnl, nad, lungs c t a, cv rrr  A: htn  P: continue toprol 50mg  qd, recheck in 3 weeks

## 2017-07-22 LAB — BASIC METABOLIC PANEL
BUN/Creatinine Ratio: 12 (ref 9–20)
BUN: 12 mg/dL (ref 6–24)
CALCIUM: 9.3 mg/dL (ref 8.7–10.2)
CHLORIDE: 105 mmol/L (ref 96–106)
CO2: 22 mmol/L (ref 20–29)
Creatinine, Ser: 1.03 mg/dL (ref 0.76–1.27)
GFR calc Af Amer: 96 mL/min/{1.73_m2} (ref >59)
GFR, EST NON AFRICAN AMERICAN: 83 mL/min/{1.73_m2} (ref >59)
Glucose: 94 mg/dL (ref 65–99)
POTASSIUM: 4.5 mmol/L (ref 3.5–5.2)
Sodium: 142 mmol/L (ref 134–144)

## 2017-07-22 LAB — SPECIMEN STATUS

## 2017-07-22 LAB — URIC ACID: Uric Acid: 4.9 mg/dL (ref 3.7–8.6)

## 2017-08-21 ENCOUNTER — Encounter: Payer: Self-pay | Admitting: Physician Assistant

## 2017-08-21 ENCOUNTER — Ambulatory Visit: Payer: Self-pay | Admitting: Physician Assistant

## 2017-08-21 MED ORDER — PHENTERMINE HCL 37.5 MG PO CAPS: 37.5000 mg | ORAL_CAPSULE | ORAL | 0 refills | Status: DC

## 2017-08-21 NOTE — Progress Notes (Signed)
S: pt here for weight and bp check, is down 10lbs from last visit, is still walking and exercising, trying to eat right, states his heart rate has not dropped into the 50s since we lowered the dose of metoprolol, bp has been good, no problems  O: vitals wnl, nad, weight is 356lb, down from 366lb on last visit, lungs c t a, cv rrr  A: morbid obesity, weight loss consult  P: phentermine 37.5mg  qd #30 nr

## 2017-09-18 ENCOUNTER — Ambulatory Visit: Payer: Self-pay | Admitting: Physician Assistant

## 2017-10-18 ENCOUNTER — Other Ambulatory Visit: Payer: Self-pay | Admitting: Physician Assistant

## 2017-10-18 DIAGNOSIS — E559 Vitamin D deficiency, unspecified: Secondary | ICD-10-CM

## 2017-10-19 NOTE — Telephone Encounter (Signed)
Med refill for vit d approved

## 2017-10-23 ENCOUNTER — Ambulatory Visit: Payer: Self-pay | Admitting: Physician Assistant

## 2017-11-17 ENCOUNTER — Other Ambulatory Visit: Payer: Self-pay | Admitting: Emergency Medicine

## 2017-11-17 DIAGNOSIS — E559 Vitamin D deficiency, unspecified: Secondary | ICD-10-CM

## 2017-11-17 MED ORDER — ALLOPURINOL 300 MG PO TABS: 300.0000 mg | ORAL_TABLET | Freq: Every day | ORAL | 2 refills | Status: DC

## 2017-11-17 MED ORDER — TELMISARTAN 80 MG PO TABS: 80.0000 mg | ORAL_TABLET | Freq: Every day | ORAL | 2 refills | Status: DC

## 2017-11-17 MED ORDER — METOPROLOL SUCCINATE 50 MG PO CS24: 50.0000 mg | EXTENDED_RELEASE_CAPSULE | Freq: Every day | ORAL | 2 refills | Status: DC

## 2017-11-17 MED ORDER — OMEPRAZOLE 20 MG PO CPDR: 20.0000 mg | DELAYED_RELEASE_CAPSULE | Freq: Every day | ORAL | 2 refills | Status: DC

## 2017-11-17 MED ORDER — VITAMIN D (ERGOCALCIFEROL) 1.25 MG (50000 UNIT) PO CAPS: 50000.0000 [IU] | ORAL_CAPSULE | ORAL | 3 refills | Status: DC

## 2017-11-20 ENCOUNTER — Other Ambulatory Visit: Payer: Self-pay | Admitting: Emergency Medicine

## 2017-11-20 DIAGNOSIS — E559 Vitamin D deficiency, unspecified: Secondary | ICD-10-CM

## 2017-11-20 MED ORDER — OMEPRAZOLE 20 MG PO CPDR: 20.0000 mg | DELAYED_RELEASE_CAPSULE | Freq: Every day | ORAL | 2 refills | Status: DC

## 2017-11-20 MED ORDER — TELMISARTAN 80 MG PO TABS: 80.0000 mg | ORAL_TABLET | Freq: Every day | ORAL | 2 refills | Status: DC

## 2017-11-20 MED ORDER — VITAMIN D (ERGOCALCIFEROL) 1.25 MG (50000 UNIT) PO CAPS: 50000.0000 [IU] | ORAL_CAPSULE | ORAL | 3 refills | Status: DC

## 2017-11-20 MED ORDER — ALLOPURINOL 300 MG PO TABS: 300.0000 mg | ORAL_TABLET | Freq: Every day | ORAL | 2 refills | Status: DC

## 2017-11-20 MED ORDER — METOPROLOL SUCCINATE 50 MG PO CS24: 50.0000 mg | EXTENDED_RELEASE_CAPSULE | Freq: Every day | ORAL | 2 refills | Status: DC

## 2017-11-20 NOTE — Telephone Encounter (Signed)
Patient need refills sent to [facility] instead of [facility].

## 2017-12-22 DIAGNOSIS — M5430 Sciatica, unspecified side: Secondary | ICD-10-CM | POA: Insufficient documentation

## 2017-12-29 ENCOUNTER — Encounter: Payer: Self-pay | Admitting: Family Medicine

## 2017-12-29 ENCOUNTER — Ambulatory Visit (INDEPENDENT_AMBULATORY_CARE_PROVIDER_SITE_OTHER): Payer: Managed Care, Other (non HMO) | Admitting: Family Medicine

## 2017-12-29 ENCOUNTER — Other Ambulatory Visit: Payer: Self-pay

## 2017-12-29 VITALS — BP 140/72 | HR 72 | Temp 98.4°F | Resp 18 | Ht 69.5 in | Wt 395.0 lb

## 2017-12-29 DIAGNOSIS — I10 Essential (primary) hypertension: Secondary | ICD-10-CM

## 2017-12-29 DIAGNOSIS — M109 Gout, unspecified: Secondary | ICD-10-CM | POA: Insufficient documentation

## 2017-12-29 DIAGNOSIS — G4733 Obstructive sleep apnea (adult) (pediatric): Secondary | ICD-10-CM

## 2017-12-29 DIAGNOSIS — Z6841 Body Mass Index (BMI) 40.0 and over, adult: Secondary | ICD-10-CM | POA: Diagnosis not present

## 2017-12-29 DIAGNOSIS — L719 Rosacea, unspecified: Secondary | ICD-10-CM | POA: Diagnosis not present

## 2017-12-29 DIAGNOSIS — J0110 Acute frontal sinusitis, unspecified: Secondary | ICD-10-CM

## 2017-12-29 DIAGNOSIS — E559 Vitamin D deficiency, unspecified: Secondary | ICD-10-CM

## 2017-12-29 DIAGNOSIS — Z7689 Persons encountering health services in other specified circumstances: Secondary | ICD-10-CM | POA: Diagnosis not present

## 2017-12-29 DIAGNOSIS — N200 Calculus of kidney: Secondary | ICD-10-CM | POA: Insufficient documentation

## 2017-12-29 DIAGNOSIS — M1A09X0 Idiopathic chronic gout, multiple sites, without tophus (tophi): Secondary | ICD-10-CM | POA: Diagnosis not present

## 2017-12-29 MED ORDER — AMOXICILLIN-POT CLAVULANATE 875-125 MG PO TABS: 1.0000 | ORAL_TABLET | Freq: Two times a day (BID) | ORAL | 0 refills | Status: DC

## 2017-12-29 MED ORDER — IPRATROPIUM BROMIDE 0.06 % NA SOLN: 2.0000 | Freq: Four times a day (QID) | NASAL | 0 refills | Status: DC

## 2017-12-29 MED ORDER — VITAMIN D 50 MCG (2000 UT) PO TABS: 2000.0000 [IU] | ORAL_TABLET | Freq: Every day | ORAL | Status: DC

## 2017-12-29 NOTE — Progress Notes (Addendum)
Subjective:    Patient ID: Scott Sanford, male    DOB: 10/28/1966, 52 y.o.   MRN: [id number]  Scott Sanford is a 52 y.o. male presenting on 12/29/2017 for Sinus Problem (sore throat, nasal drainage,runny eyes and  post nasal drainage. x 15 days )  Here to establish care with new PCP, previously followed by Employee Health through [facility] (through [facility]).  HPI   Acute vs Subacute Sinsuitis Reports symptoms since 12/15/17 with sinus drainage, he had trip to [place] for [employer] trip, took OTC Coricidin HBP with some relief, then seemed to improve but did not resolve, now for >2 weeks. He was having more sinus pain and pressure with some headaches, seems improved today. Also with some thicker nasal congestion greenish brown with some "blood streaks". Improved on Coricidin - Has Flonase at home, using again for past 2 weeks, limited relief - Additionally with sore throat worsening since yesterday - He does state that he had similar issue in 2006, and he was treated with Azithromycin and then he ended up in hospital with Pneumonia - Also he is taking Doxycycline IR 40mg  daily, he was off for few days waiting on refill, now asking if should continue, he is on this per Dermatology for Rosacea - Admits some bilateral ear itching without pain - Admits occasional bodyaches - Admits rare sweating episode - Admits occasional cough, limited non productive - Denies any fevers/chills, nausea vomiting, abdominal pain, diarrhea  CHRONIC HTN: Reports prior history of HTN. Never had routine f/u with Cardiology, he had prior pre-op clearance and eval from Cardiology due to gallbladder issue. Never on other anti-HTN meds in past. He did go to a Cardiologist for screening, had a stress test in 2011. Current Meds - Metoprolol XL 50mg  daily, Telmisartan 80mg  daily   Reports good compliance, took meds today. Tolerating well, w/o complaints.  OSA on BiPAP - Previously seen by Pulmonology had PSG  in 2006, then new machine in 2010, has been on BiPAP for long time now, doing well on it, but has not had follow-up, will consider this in future  Morbid Obesity BMI >57 Chronic problem abnormal weight gain and obesity. He went down from 440 to 352 lbs, on phentermine that was rx by employee health, and tolerated it well. Then stopped taking med few months ago. He has had problem with worsening lifestyle again, holidays and also his girlfriend left him. Not interested to resume Phentermine at this time.  History of Gout / Uric Acid Nephrolithiasis (Left) He is taking Allopurinol 300mg  daily, without any recent gout flares. He has complication with Uric Acid Kidney stones in past and reportedly x 2 on Left kidney now. Initial dx by Dr Yves Dill [facility]) in 06/2017, he passed one stone, painful episode, and confirmed x 2 in Left kidney on imaging, he is unsure if has follow-up in future.  Rosacea Followed by Dr Nehemiah Massed, [place] for facial Rosacea, he is currently on Doxycycline 40mg  daily, has recently run out, waiting on new rx. It seems to work well control facial symptoms  S/p Cholecystectomy 2006 History of gallstones and RUQ pain, biliary complications, required cholecystectomy He has history of problems with his bowels, recently over past 1 month, he has had less issues with diarrhea after meals.   Health Maintenance:  UTD Flu Vaccine, [facility], has card verified from employee clinic 04/17/11, next due 03/2021  Colon CA Screening: Never had  colonoscopy. Now age 18, no prior screening. Currently asymptomatic. No known family history of colon CA. Due for screening test, defer for now. He declines.  Prostate CA Screening: Prior PSA / DRE reported normal, he was followed by Urology years ago. Prior employee health has done PSA by report. He does not recall lab value. Last PSA 0.5 (09/26/16). Currently asymptomatic. No known family history  of prostate CA. Due for screening with PSA. - Does admit to prior history of chronic prostatitis years ago, resolved s/p 6 months of cipro, required 2nd opinion from [facility]    Depression screen [facility] 2/9 12/29/2017  Decreased Interest 0  Down, Depressed, Hopeless 0  PHQ - 2 Score 0  Tired, decreased energy 1  Change in appetite 0  Feeling bad or failure about yourself  0  Trouble concentrating 0  Moving slowly or fidgety/restless 0  Suicidal thoughts 0  Difficult doing work/chores Not difficult at all    Past Medical History:  Diagnosis Date  . 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 Hypertension   . Sleep apnea    Past Surgical History:  Procedure Laterality Date  . CHOLECYSTECTOMY  2006   Gallstones  . TONSILLECTOMY  1984   Social History   Socioeconomic History  . Marital status: Married    Spouse name: Not on file  . Number of children: Not on file  . Years of education: College  . Highest education level: Bachelor's degree (e.g., BA, AB, BS)  Social Needs  . Financial resource strain: Not on file  . Food insecurity - worry: Not on file  . Food insecurity - inability: Not on file  . Transportation needs - medical: Not on file  . Transportation needs - non-medical: Not on file  Occupational History  . Occupation: [employer] Dept  Tobacco Use  . Smoking status: Never Smoker  . Smokeless tobacco: Never Used  Substance and Sexual Activity  . Alcohol use: Yes    Alcohol/week: 0.6 oz    Types: 1 Standard drinks or equivalent per week    Comment: occasionally  . Drug use: No  . Sexual activity: Not on file  Other Topics Concern  . Not on file  Social History Narrative  . Not on file   Family History  Problem Relation Age of Onset  . Lung cancer Mother        2015, apex of lung surgical removal  . Diabetes Father   . Dementia Paternal Grandmother   . Stroke Paternal Grandfather   . Prostate  cancer Neg Hx   . Colon cancer Neg Hx    Current Outpatient Medications on File Prior to Visit  Medication Sig  . allopurinol (ZYLOPRIM) 300 MG tablet Take 1 tablet (300 mg total) by mouth daily.  Marland Kitchen aspirin 81 MG chewable tablet Chew by mouth daily.  Marland Kitchen doxycycline (ORACEA) 40 MG capsule TAKE 1 CAPSULE BY MOUTH DAILY WITH FOOD  . fluticasone (FLONASE) 50 MCG/ACT nasal spray PLACE 2 SPRAYS INTO BOTH NOSTRILS DAILY.  . Ginkgo Biloba 40 MG TABS Take by mouth.  . Metoprolol Succinate 50 MG CS24 Take 50 mg by mouth daily.  . Omega-3 Fatty Acids (FISH OIL PO) Take by mouth.  Marland Kitchen omeprazole (PRILOSEC) 20 MG capsule Take 1 capsule (20 mg total) by mouth daily.  Marland Kitchen telmisartan (MICARDIS) 80 MG tablet Take 1 tablet (80 mg total) by mouth at bedtime.   No current  facility-administered medications on file prior to visit.     Review of Systems  Constitutional: Negative for activity change, appetite change, chills, diaphoresis, fatigue, fever and unexpected weight change.  HENT: Positive for congestion, postnasal drip, sinus pressure, sinus pain and sore throat. Negative for ear discharge, hearing loss and rhinorrhea.   Eyes: Negative for visual disturbance.  Respiratory: Negative for apnea, cough, chest tightness, shortness of breath and wheezing.   Cardiovascular: Negative for chest pain, palpitations and leg swelling.  Gastrointestinal: Negative for abdominal distention, abdominal pain, anal bleeding, blood in stool, constipation, diarrhea, nausea and vomiting.  Endocrine: Negative for cold intolerance and polyuria.  Genitourinary: Negative for decreased urine volume, difficulty urinating, dysuria, frequency, hematuria and urgency.  Musculoskeletal: Negative for arthralgias, back pain and neck pain.  Skin: Negative for rash.  Allergic/Immunologic: Negative for environmental allergies.  Neurological: Negative for dizziness, weakness, light-headedness, numbness and headaches.  Hematological: Negative  for adenopathy.  Psychiatric/Behavioral: Negative for behavioral problems, dysphoric mood and sleep disturbance. The patient is not nervous/anxious.    Per HPI unless specifically indicated above     Objective:    BP 140/72 (BP Location: Right Arm, Patient Position: Sitting, Cuff Size: Large)   Pulse 72   Temp 98.4 F (36.9 C) (Oral)   Resp 18   Ht 5' 9.5" (1.765 m)   Wt (!) 395 lb (179.2 kg)   SpO2 99%   BMI 57.50 kg/m   Wt Readings from Last 3 Encounters:  12/29/17 (!) 395 lb (179.2 kg)  08/21/17 (!) 356 lb (161.5 kg)  07/17/17 (!) 366 lb (166 kg)    Physical Exam  Constitutional: He is oriented to person, place, and time. He appears well-developed and well-nourished. No distress.  Well-appearing, comfortable, cooperative, morbid obesity  HENT:  Head: Normocephalic and atraumatic.  Mouth/Throat: Oropharynx is clear and moist.  Frontal / maxillary sinuses non-tender. Nares mostly patent some turbinate edema with congestion without purulence. Bilateral TMs clear without erythema, effusion or bulging. Oropharynx with some mild posterior pharyngeal drainage and mild erythema without exudates, edema or asymmetry.  Eyes: Conjunctivae are normal. Right eye exhibits no discharge. Left eye exhibits no discharge.  Neck: Normal range of motion. Neck supple. No thyromegaly present.  Cardiovascular: Normal rate, regular rhythm, normal heart sounds and intact distal pulses.  No murmur heard. Pulmonary/Chest: Effort normal and breath sounds normal. No respiratory distress. He has no wheezes. He has no rales.  Musculoskeletal: Normal range of motion. He exhibits no edema.  Lymphadenopathy:    He has no cervical adenopathy.  Neurological: He is alert and oriented to person, place, and time.  Skin: Skin is warm and dry. No rash noted. He is not diaphoretic. No erythema.  Very mild erythema of face / cheeks bilateral involving ears, consistent with rosacea, no evidence of infection or other  skin involvement. No nodular or cystic lesions with rosacea  Psychiatric: He has a normal mood and affect. His behavior is normal.  Well groomed, good eye contact, normal speech and thoughts  Nursing note and vitals reviewed.  Results for orders placed or performed in visit on 40/08/67  Basic Metabolic Panel (BMET)  Result Value Ref Range   Glucose 94 65 - 99 mg/dL   BUN 12 6 - 24 mg/dL   Creatinine, Ser 1.03 0.76 - 1.27 mg/dL   GFR calc non Af Amer 83 >59 mL/min/1.73   GFR calc Af Amer 96 >59 mL/min/1.73   BUN/Creatinine Ratio 12 9 - 20   Sodium 142  134 - 144 mmol/L   Potassium 4.5 3.5 - 5.2 mmol/L   Chloride 105 96 - 106 mmol/L   CO2 22 20 - 29 mmol/L   Calcium 9.3 8.7 - 10.2 mg/dL  Uric acid  Result Value Ref Range   Uric Acid 4.9 3.7 - 8.6 mg/dL  Specimen Status  Result Value Ref Range   WBC WILL FOLLOW    RBC WILL FOLLOW    Hemoglobin WILL FOLLOW    Hematocrit WILL FOLLOW    MCV WILL FOLLOW    MCH WILL FOLLOW    MCHC WILL FOLLOW    RDW WILL FOLLOW    Platelets WILL FOLLOW    Neutrophils WILL FOLLOW    Lymphs WILL FOLLOW    Monocytes WILL FOLLOW    Eos WILL FOLLOW    Basos WILL FOLLOW    Neutrophils Absolute WILL FOLLOW    Lymphocytes Absolute WILL FOLLOW    Monocytes Absolute WILL FOLLOW    EOS (ABSOLUTE) WILL FOLLOW    Basophils Absolute WILL FOLLOW    Immature Granulocytes WILL FOLLOW    Immature Grans (Abs) WILL FOLLOW       Assessment & Plan:   Problem List Items Addressed This Visit    Gout    Stable without acute flare Controlled on Allopurinol 300mg  daily Complication with Uric Acid nephrolithiasis on imaging L side x 2, per Dr Yves Dill (2018) - Last uric acid level 4.9 (07/21/17)  Plan: 1. Continue Allopurinol 300mg  daily for prophylaxis 2. Advised to follow-up with Urology regarding Uric Acid Nephrolithiasis - future would consider dissolving by alkalinzing urine with K-citrate 10-67mEq BID      Hypertension    Mildly elevated initial BP, repeat  manual check improved. - Home BP readings not available today  No known complications - Failed Lisinopril-HCTZ - side effects    Plan:  1. Continue current BP regimen - Metoprolol XL 50mg  daily, Telmisartan 80mg  daily 2. Encourage improved lifestyle - low sodium diet, improve regular exercise 3. Start monitor BP outside office, bring readings to next visit, if persistently >140/90 or new symptoms notify office sooner 4. Follow-up 6 months sooner if needed / review outside labs      Relevant Medications   aspirin 81 MG chewable tablet   Morbid obesity with BMI of 50.0-59.9, adult (HCC)    Abnormal weight gain over past >4-6 months, wt gain +30-40 lbs, recently off Phentermine, and poor lifestyle changes worsening diet and limited exercise, relationship status change Secondary HTN OSA likely  Encouraged improve lifestyle diet and exercise, goal lose 1 lb weekly Advised may seek other treatment options if not improving, consider referral nutrition, bariatrics, wt management, we can consider other meds, advised that I do not routinely rx Phentermine      OSA treated with BiPAP    Well controlled, chronic OSA on BiPAP, now >13 years, had initial PSG, not aware of results or records if available - Good adherence to BiPAP nightly - Continue current BiPAP therapy, patient seems to be benefiting from therapy - In future if needs update will consider referral to Pulmonology given morbid obesity likely factor and on BiPAP      Rosacea    Stable chronic problem Controlled on low dose Doxycycline 40mg  daily suppression - HOLD now for 10 days while on Augmentin, then resume Followed by Dermatology ([facility], Dr Nehemiah Massed)      Uric acid nephrolithiasis    Known Gout history Complication with Uric Acid nephrolithiasis on imaging  L side x 2, per Dr Yves Dill (2018) - Last uric acid level 4.9 (07/21/17)  Plan: 1. Continue Allopurinol 300mg  daily for prophylaxis 2. Advised to follow-up with  Urology regarding Uric Acid Nephrolithiasis - future would consider dissolving by alkalinzing urine with K-citrate 10-76mEq BID       Other Visit Diagnoses    Acute non-recurrent frontal sinusitis    -  Primary  Consistent with acute vs subacute frontal sinusitis, likely initially viral URI now with persistent sinus symptoms concern possible bacterial infection duration >2 weeks  Plan: 1. Start Augmentin 875-125mg  PO BID x 10 days 2. Start Atrovent nasal spray decongestant 2 sprays in each nostril up to 4 times daily for 7 days - may hold Flonase for now 1 week 3. Supportive care with nasal saline OTC, hydration, warm compresses 4. Return criteria reviewed     Relevant Medications   ipratropium (ATROVENT) 0.06 % nasal spray   amoxicillin-clavulanate (AUGMENTIN) 875-125 MG tablet   Encounter to establish care with new doctor      - Request records from prior PCP     #Vitamin D Deficiency  Prior low Vit D Was on Vit D 50k for months Advised we can re-check Vit D in future, now can lower to maintenance dose Vit D3 1-2k daily  Meds ordered this encounter  Medications  . ipratropium (ATROVENT) 0.06 % nasal spray    Sig: Place 2 sprays into both nostrils 4 (four) times daily. For up to 5-7 days then stop.    Dispense:  15 mL    Refill:  0  . amoxicillin-clavulanate (AUGMENTIN) 875-125 MG tablet    Sig: Take 1 tablet by mouth 2 (two) times daily. For 10 days    Dispense:  20 tablet    Refill:  0  . Cholecalciferol (VITAMIN D) 2000 units tablet    Sig: Take 1 tablet (2,000 Units total) by mouth daily.   Follow up plan: Return in about 6 months (around 06/28/2018) for Obesity/Weight check, HTN, OSA, outside lab review.  He will have labs and biometric done through [facility], and follow-up with me to determine if needs other lab tests.  Nobie Putnam, [facility] 12/29/2017, 6:27 PM

## 2017-12-29 NOTE — Assessment & Plan Note (Signed)
Prior low Vit D Was on Vit D 50k for months Advised we can re-check Vit D in future, now can lower to maintenance dose Vit D3 1-2k daily

## 2017-12-29 NOTE — Patient Instructions (Addendum)
Thank you for coming to the office today.  1. It sounds like you have a Sinusitis (Bacterial Infection) - this most likely started as an Upper Respiratory Virus that has settled into an infection. Allergies can also cause this. - Start Augmentin 1 pill twice daily (breakfast and dinner, with food and plenty of water) for 10 days, complete entire course, do not stop early even if feeling better - Start Atrovent nasal spray decongestant 2 sprays in each nostril up to 4 times daily for 7 days  May try generic plain mucinex without any additives for 1 week  After finish antibiotic - then resume Doxycycline  - Recommend to start keep using Nasal Saline spray multiple times a day to help flush out congestion and clear sinuses - Improve hydration by drinking plenty of clear fluids (water, gatorade) to reduce secretions and thin congestion - Congestion draining down throat can cause irritation. May try warm herbal tea with honey, cough drops - Can take Tylenol or Ibuprofen as needed for fevers  If you develop persistent fever >101F for at least 3 consecutive days, headaches with sinus pain or pressure or persistent earache, please schedule a follow-up evaluation within next few days to week.  Follow-up with [facility] - Dr Yves Dill find out about surveillance on kidney stones  Please schedule a Follow-up Appointment to: Return in about 6 months (around 06/28/2018) for Obesity/Weight check, HTN, OSA, outside lab review.    If you have any other questions or concerns, please feel free to call the office or send a message through [place]. You may also schedule an earlier appointment if necessary.  Additionally, you may be receiving a survey about your experience at our office within a few days to 1 week by e-mail or mail. We value your feedback.  Nobie Putnam, DO [place]

## 2017-12-29 NOTE — Assessment & Plan Note (Signed)
Abnormal weight gain over past >4-6 months, wt gain +30-40 lbs, recently off Phentermine, and poor lifestyle changes worsening diet and limited exercise, relationship status change Secondary HTN OSA likely  Encouraged improve lifestyle diet and exercise, goal lose 1 lb weekly Advised may seek other treatment options if not improving, consider referral nutrition, bariatrics, wt management, we can consider other meds, advised that I do not routinely rx Phentermine

## 2017-12-29 NOTE — Assessment & Plan Note (Signed)
Stable without acute flare Controlled on Allopurinol 300mg  daily Complication with Uric Acid nephrolithiasis on imaging L side x 2, per Dr Yves Dill (2018) - Last uric acid level 4.9 (07/21/17)  Plan: 1. Continue Allopurinol 300mg  daily for prophylaxis 2. Advised to follow-up with Urology regarding Uric Acid Nephrolithiasis - future would consider dissolving by alkalinzing urine with K-citrate 10-33mEq BID

## 2017-12-29 NOTE — Assessment & Plan Note (Signed)
Mildly elevated initial BP, repeat manual check improved. - Home BP readings not available today  No known complications - Failed Lisinopril-HCTZ - side effects    Plan:  1. Continue current BP regimen - Metoprolol XL 50mg  daily, Telmisartan 80mg  daily 2. Encourage improved lifestyle - low sodium diet, improve regular exercise 3. Start monitor BP outside office, bring readings to next visit, if persistently >140/90 or new symptoms notify office sooner 4. Follow-up 6 months sooner if needed / review outside labs

## 2017-12-29 NOTE — Assessment & Plan Note (Signed)
Known Gout history Complication with Uric Acid nephrolithiasis on imaging L side x 2, per Dr Yves Dill (2018) - Last uric acid level 4.9 (07/21/17)  Plan: 1. Continue Allopurinol 300mg  daily for prophylaxis 2. Advised to follow-up with Urology regarding Uric Acid Nephrolithiasis - future would consider dissolving by alkalinzing urine with K-citrate 10-37mEq BID

## 2017-12-29 NOTE — Assessment & Plan Note (Signed)
Stable chronic problem Controlled on low dose Doxycycline 40mg  daily suppression - HOLD now for 10 days while on Augmentin, then resume Followed by Dermatology ([facility], Dr Nehemiah Massed)

## 2017-12-29 NOTE — Addendum Note (Signed)
Addended by: Olin Hauser on: 12/29/2017 06:32 PM   Modules accepted: Level of Service

## 2017-12-29 NOTE — Assessment & Plan Note (Signed)
Well controlled, chronic OSA on BiPAP, now >13 years, had initial PSG, not aware of results or records if available - Good adherence to BiPAP nightly - Continue current BiPAP therapy, patient seems to be benefiting from therapy - In future if needs update will consider referral to Pulmonology given morbid obesity likely factor and on BiPAP 

## 2018-06-15 ENCOUNTER — Ambulatory Visit: Payer: Self-pay | Admitting: Family Medicine

## 2018-06-15 VITALS — BP 102/56 | HR 63 | Resp 17 | Ht 70.0 in | Wt >= 6400 oz

## 2018-06-15 DIAGNOSIS — Z0189 Encounter for other specified special examinations: Principal | ICD-10-CM

## 2018-06-15 DIAGNOSIS — Z008 Encounter for other general examination: Secondary | ICD-10-CM

## 2018-06-15 NOTE — Progress Notes (Signed)
Subjective: Annual biometrics screening labs Patient presents for his annual biometric screening labs only.  Patient had his annual physical exam with his primary care provider on 12/29/2017.  Patient reports eating a healthy, well-rounded diet.  Patient denies getting regular physical activity.  Patient regularly sees his primary care provider. PCP: Dr. Parks Ranger. Patient works for the [employer] [place]. Patient denies any other issues or concerns.   Assessment Annual biometrics screening labs  Plan  Lipid panel and fasting blood sugar pending. Encouraged routine visits with primary care provider.  Patient's blood pressure is 102/56.  Patient reports this is normal for him and reports being asymptomatic. Encouraged patient to get regular exercise and eat a healthy, well-rounded diet.

## 2018-06-16 LAB — LIPID PANEL
Chol/HDL Ratio: 4.5 ratio (ref 0.0–5.0)
Cholesterol, Total: 194 mg/dL (ref 100–199)
HDL: 43 mg/dL (ref >39)
LDL CALC: 127 mg/dL — AB (ref 0–99)
Triglycerides: 122 mg/dL (ref 0–149)
VLDL CHOLESTEROL CAL: 24 mg/dL (ref 5–40)

## 2018-06-16 LAB — GLUCOSE, RANDOM: GLUCOSE: 113 mg/dL — AB (ref 65–99)

## 2018-06-16 NOTE — Progress Notes (Signed)
Dear Scott Sanford, I wanted to let you know that your lipid panel and fasting blood sugar came back.  Everything is normal, with the exception of your LDL cholesterol and fasting blood sugar. Your LDL cholesterol ("bad cholesterol") is elevated at 127, normal values are below 99.   Your fasting blood sugar is elevated at 113, normal values are between 65 and 99.  This may represent "prediabetes" but further evaluation is necessary.  I want you to follow-up with your primary care provider regarding these results.

## 2018-06-17 ENCOUNTER — Telehealth: Payer: Self-pay | Admitting: Family Medicine

## 2018-06-17 DIAGNOSIS — I10 Essential (primary) hypertension: Secondary | ICD-10-CM

## 2018-06-17 MED ORDER — METOPROLOL SUCCINATE 50 MG PO CS24: 50.0000 mg | EXTENDED_RELEASE_CAPSULE | Freq: Every day | ORAL | 3 refills | Status: DC

## 2018-06-17 NOTE — Telephone Encounter (Signed)
Refilled  Scott Sanford, [facility] 06/17/2018, 1:13 PM

## 2018-06-17 NOTE — Telephone Encounter (Signed)
Pt. Called requesting refill on  metoprolol 50 mg (pt  States he only have 1 left) [facility]. Pt call back  # is  [phone]

## 2018-07-02 ENCOUNTER — Ambulatory Visit: Payer: Managed Care, Other (non HMO) | Admitting: Family Medicine

## 2018-07-14 IMAGING — CT CT RENAL STONE PROTOCOL
2 of 4 series · 17 of 46 positions shown, 19 images · non-contrast
Comparison: Ultrasound 02/05/2005

CLINICAL DATA: Right flank pain with microscopic hematuria.

EXAM:
CT ABDOMEN AND PELVIS WITHOUT CONTRAST
TECHNIQUE: Multidetector CT imaging of the abdomen and pelvis was performed
following the standard protocol without IV contrast.

[Series 2: stone full standard · axial · 0.98mm/px · z∈[-968,-528]mm · 14 of 98 slices shown, 16 images]
[im 5/98  soft-tissue]
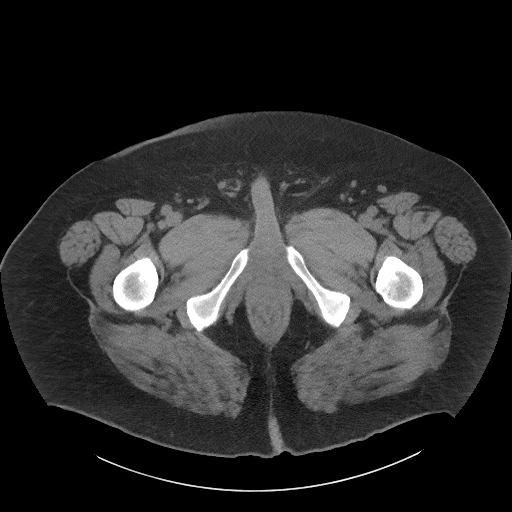
[im 5/98  bone]
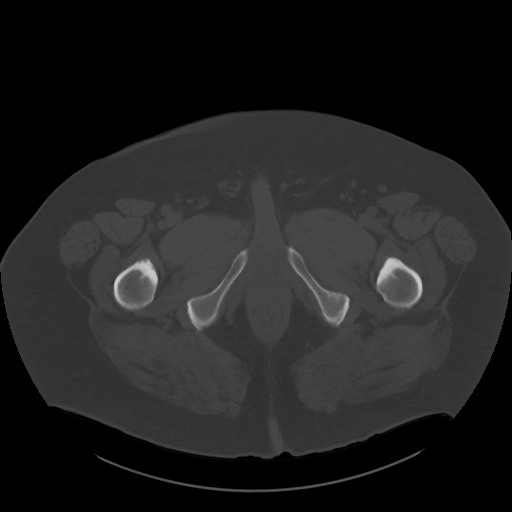
[im 13/98  soft-tissue]
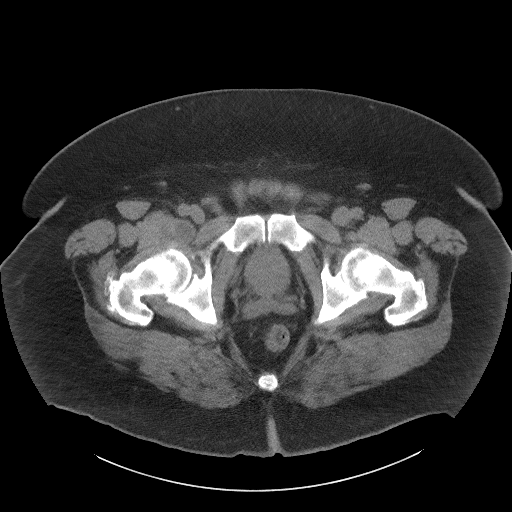
[im 17/98  soft-tissue]
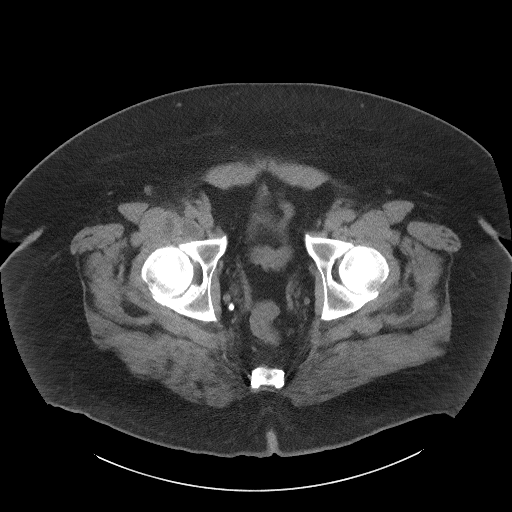
[im 26/98  soft-tissue]
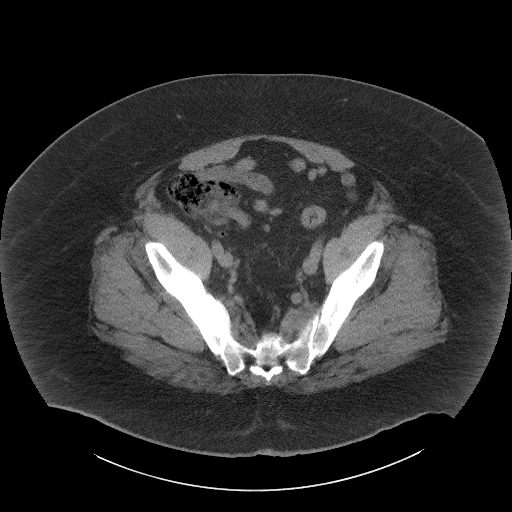
[im 34/98  soft-tissue]
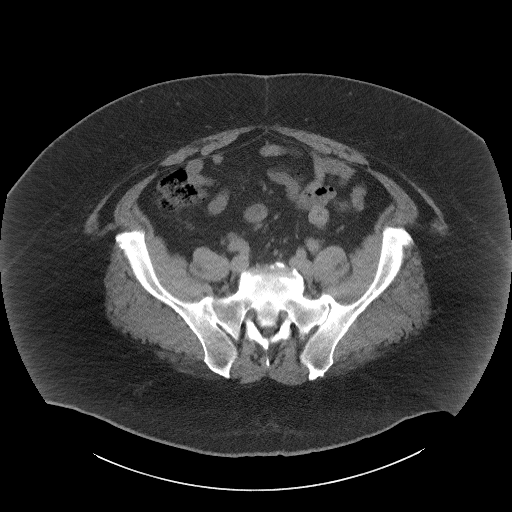
[im 38/98  soft-tissue]
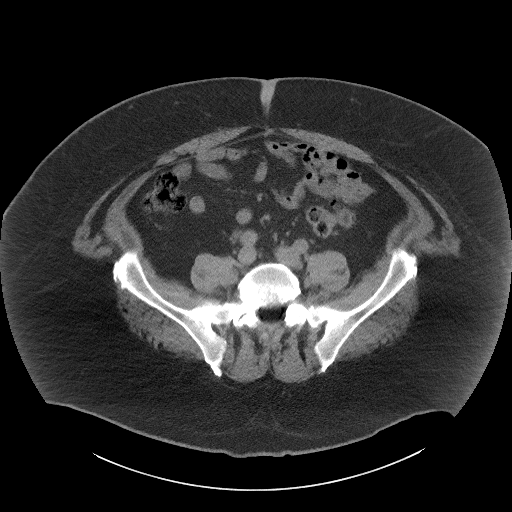
[im 47/98  soft-tissue]
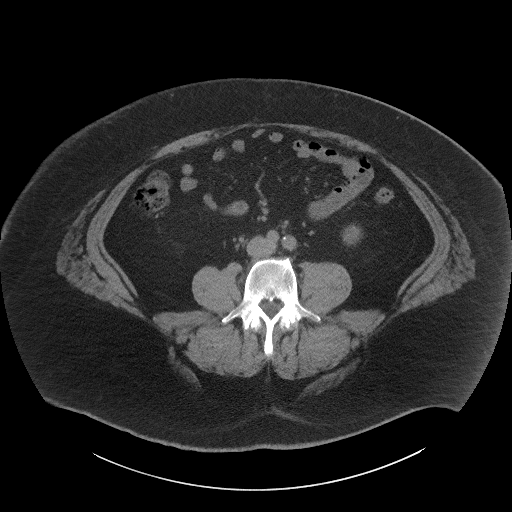
[im 51/98  soft-tissue]
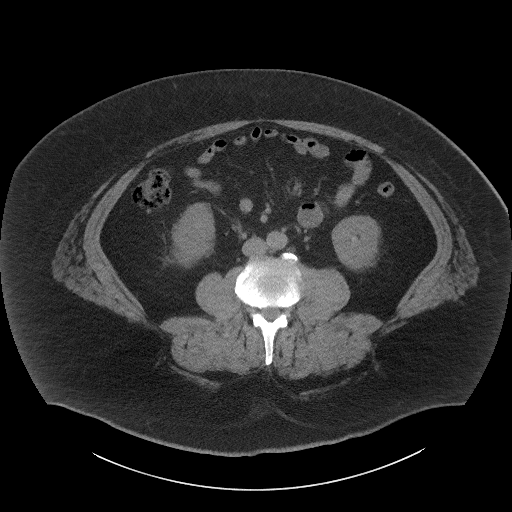
[im 60/98  soft-tissue]
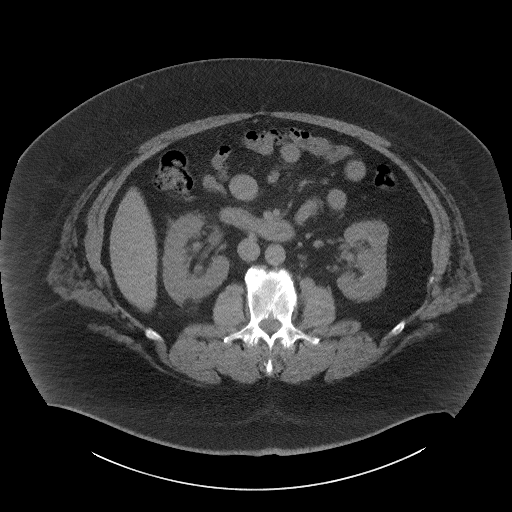
[im 60/98  bone]
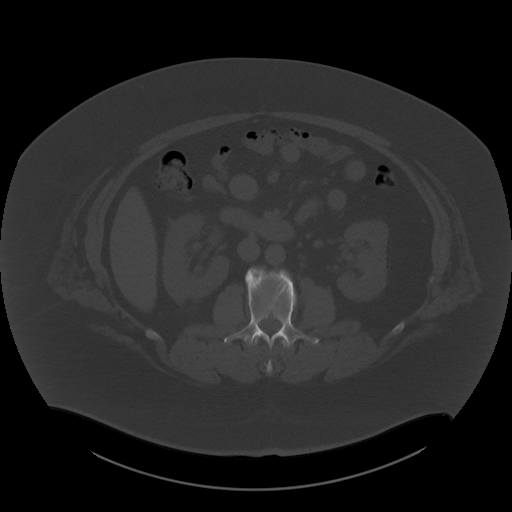
[im 64/98  soft-tissue]
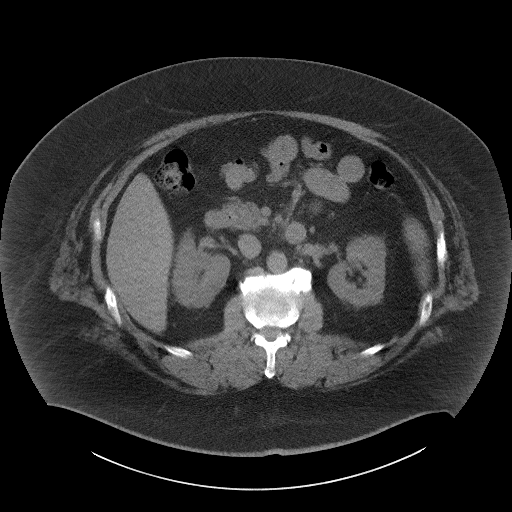
[im 72/98  soft-tissue]
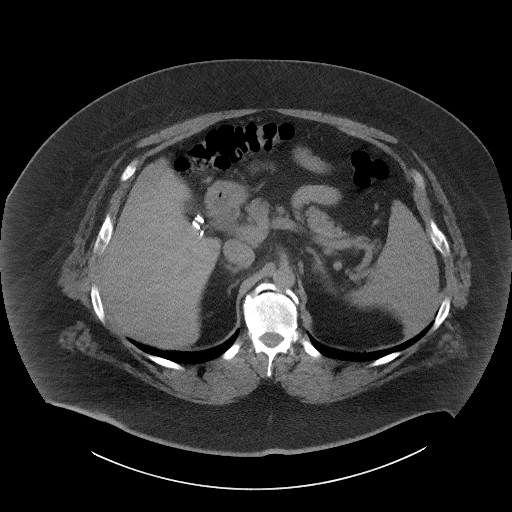
[im 81/98  soft-tissue]
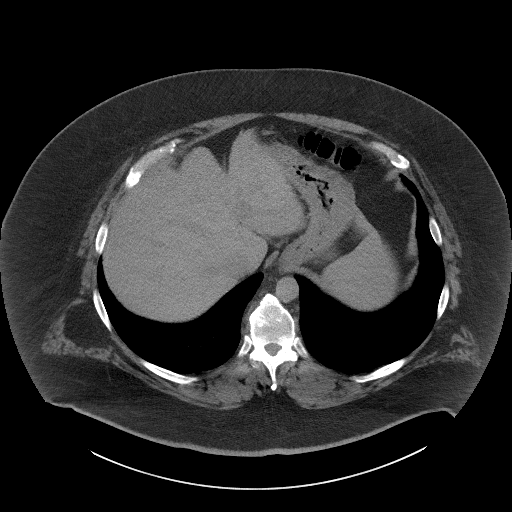
[im 85/98  soft-tissue]
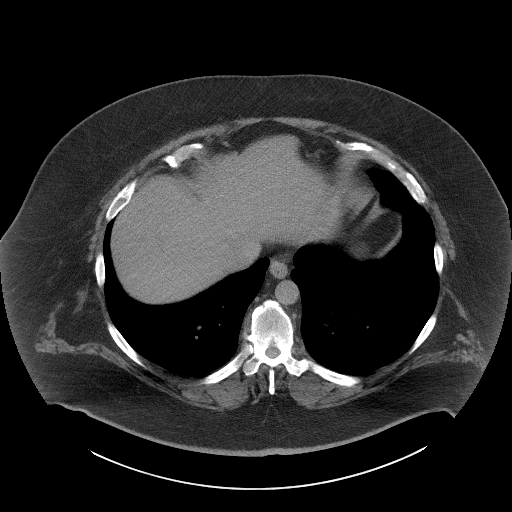
[im 93/98  soft-tissue]
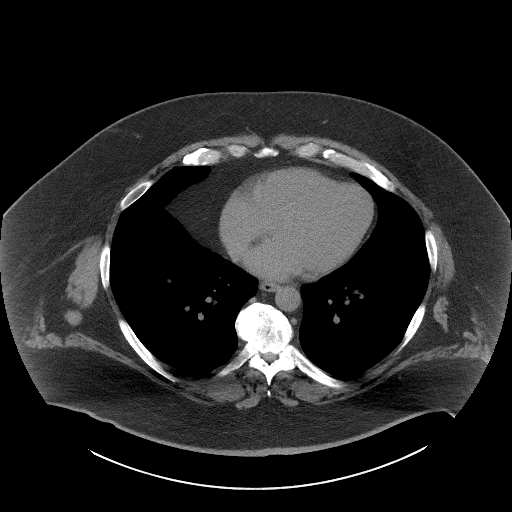

[Series 5: coronal · coronal · 0.89mm/px · 3 of 173 slices shown]
[im 58/173  soft-tissue]
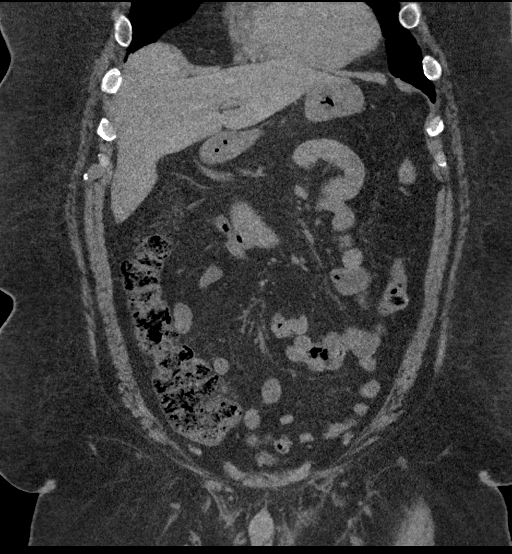
[im 77/173  soft-tissue]
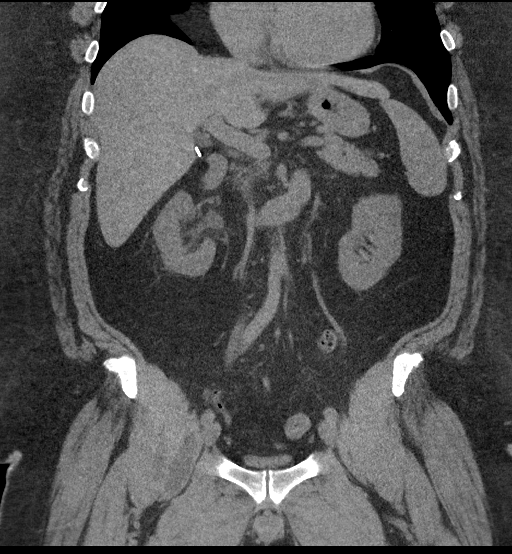
[im 96/173  soft-tissue]
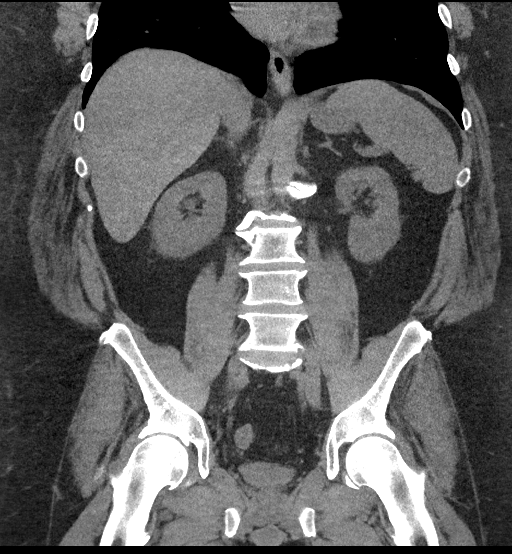

[17 of 46 positions shown; findings below may reference images not displayed]

FINDINGS: Lower chest: Normal

Hepatobiliary: Previous cholecystectomy. No hepatic parenchymal
abnormality seen.

Pancreas: Normal

Spleen: Normal

Adrenals/Urinary Tract: Adrenal glands are normal. Left renal
parenchyma is normal. 3 mm nonobstructing stone in the lower pole.
No left hydronephrosis. The right kidney shows a nonobstructing 1 mm
stone in the midportion. There is mild swelling of the right kidney
and mild right hydroureteronephrosis with a 1 mm stone in the distal
right ureter just proximal to the UVJ. No stone in the bladder.

Stomach/Bowel: No abnormal bowel finding.  Normal appendix.

Vascular/Lymphatic: Aortic atherosclerosis. No aneurysm. IVC is
normal. No retroperitoneal adenopathy.

Reproductive: Normal

Other: No free fluid or air.

Musculoskeletal: Ordinary degenerative changes of the lumbar spine.
IMPRESSION: 1 mm stone in the distal right ureter just proximal to the UVJ. Mild
fullness of the right renal collecting system and ureter and mild
swelling of the right kidney.

Nonobstructing 1 mm stone in the midportion of the right kidney.
Nonobstructing 3 mm stone in the lower pole of left kidney.

## 2018-07-22 ENCOUNTER — Ambulatory Visit: Payer: Managed Care, Other (non HMO) | Admitting: Family Medicine

## 2018-08-13 ENCOUNTER — Encounter: Payer: Self-pay | Admitting: Family Medicine

## 2018-08-13 ENCOUNTER — Ambulatory Visit: Payer: Managed Care, Other (non HMO) | Admitting: Family Medicine

## 2018-08-13 VITALS — BP 132/64 | HR 59 | Temp 98.4°F | Resp 16 | Ht 70.0 in | Wt >= 6400 oz

## 2018-08-13 DIAGNOSIS — E785 Hyperlipidemia, unspecified: Secondary | ICD-10-CM | POA: Insufficient documentation

## 2018-08-13 DIAGNOSIS — Z6841 Body Mass Index (BMI) 40.0 and over, adult: Secondary | ICD-10-CM

## 2018-08-13 DIAGNOSIS — E782 Mixed hyperlipidemia: Secondary | ICD-10-CM

## 2018-08-13 DIAGNOSIS — G4733 Obstructive sleep apnea (adult) (pediatric): Secondary | ICD-10-CM

## 2018-08-13 DIAGNOSIS — K219 Gastro-esophageal reflux disease without esophagitis: Secondary | ICD-10-CM

## 2018-08-13 DIAGNOSIS — N200 Calculus of kidney: Secondary | ICD-10-CM

## 2018-08-13 DIAGNOSIS — R7309 Other abnormal glucose: Secondary | ICD-10-CM

## 2018-08-13 DIAGNOSIS — M1A09X0 Idiopathic chronic gout, multiple sites, without tophus (tophi): Secondary | ICD-10-CM

## 2018-08-13 DIAGNOSIS — Z114 Encounter for screening for human immunodeficiency virus [HIV]: Secondary | ICD-10-CM

## 2018-08-13 DIAGNOSIS — I10 Essential (primary) hypertension: Secondary | ICD-10-CM

## 2018-08-13 DIAGNOSIS — Z125 Encounter for screening for malignant neoplasm of prostate: Secondary | ICD-10-CM

## 2018-08-13 MED ORDER — METOPROLOL SUCCINATE ER 50 MG PO TB24: 50.0000 mg | ORAL_TABLET | Freq: Every day | ORAL | 3 refills | Status: DC

## 2018-08-13 MED ORDER — OMEPRAZOLE 20 MG PO CPDR: 20.0000 mg | DELAYED_RELEASE_CAPSULE | Freq: Every day | ORAL | 3 refills | Status: DC

## 2018-08-13 MED ORDER — ALLOPURINOL 300 MG PO TABS: 300.0000 mg | ORAL_TABLET | Freq: Every day | ORAL | 3 refills | Status: DC

## 2018-08-13 MED ORDER — TELMISARTAN 80 MG PO TABS: 80.0000 mg | ORAL_TABLET | Freq: Every day | ORAL | 3 refills | Status: DC

## 2018-08-13 NOTE — Progress Notes (Signed)
Subjective:    Patient ID: Scott Sanford, male    DOB: Mar 02, 1966, 52 y.o.   MRN: [id number]  Scott Sanford is a 52 y.o. male presenting on 08/13/2018 for Weight Check; Hypertension; and Hyperlipidemia   HPI   CHRONIC HTN: No new concerns Current Meds - Metoprolol XL 50mg  daily (he was given alternative type of metoprolol "sprinkle" last time still working well, asking for change to XL), Telmisartan 80mg  daily   Reports good compliance, took meds today. Tolerating well, w/o complaints.  OSA, on BiPAP -  Previously seen by Pulmonology had PSG in 2006, then new machine in 2010, has been on BiPAP for long time now, doing well on it - Today he reports that sleep apnea is well controlled. He uses the BiPAP machine every night. Tolerates the machine well, and thinks that sleeps better with it and feels good. No new concerns or symptoms.  HYPERLIPIDEMIA  - Reports no concerns. Last lipid panel 05/2018 through work, mildly elevated LDL otherwise normal TG HDL - Currently taking Fish Oil 2400 BID, tolerating well without side effects or myalgias - Never on Statin - Taking ASA 81mg  daily  GERD Controlled on PPI Omeprazole 20mg  daily Had prior endoscopy by his report.  Morbid Obesity BMI >58 Today reports concern with weight. Used to take Phentermine from employee health in past. Abnormal weight gain 50 lbs in 1 year, stopped walking regularly. Interested in medication options he will check cost and also may consider other lifestyle intervention programs. - Diet: Reducing portion size - Exercise: less active, limited walking  History of Gout / Uric Acid Nephrolithiasis (Left) Due for lab for Uric Acid today. He is taking Allopurinol 300mg  daily, without any recent gout flares. He has complication with Uric Acid Kidney stones in past and reportedly x 2 on Left kidney now. Initial dx by Dr Yves Dill [facility]) in 06/2017, he passed one stone, painful episode, and confirmed x 2 in Left kidney  on imaging, he is unsure if has follow-up in future.   Health Maintenance:  UTD Shingrix x 2 doses in 2018  UTD Flu Vaccine, reported 09/2017 at [facility] - he had flu virus back in 01/2018  UTD TDap, has card verified from employee clinic 04/17/11, next due 03/2021  Colon CA Screening: Never had colonoscopy. Now age 8, no prior screening. Currently asymptomatic. No known family history of colon CA. Due for screening test, defer for now. He will consider Cologuard vs Colonoscopy at next visit 01/2019.  Prostate CA Screening: Prior PSA / DRE reported normal, he was followed by Urology years ago. Prior employee health has done PSA by report. He does not recall lab value. Last PSA 0.5 (09/26/16). Currently asymptomatic. No known family history of prostate CA. Due for screening with PSA today. - Does admit to prior history of chronic prostatitis years ago, resolved s/p 6 months of cipro, required 2nd opinion from [facility]   Depression screen [facility] 2/9 08/13/2018 12/29/2017  Decreased Interest 0 0  Down, Depressed, Hopeless 0 0  PHQ - 2 Score 0 0  Tired, decreased energy - 1  Change in appetite - 0  Feeling bad or failure about yourself  - 0  Trouble concentrating - 0  Moving slowly or fidgety/restless - 0  Suicidal thoughts - 0  Difficult doing work/chores - Not difficult at all    Social History   Tobacco Use  . Smoking status: Never Smoker  . Smokeless tobacco: Never Used  Substance  Use Topics  . Alcohol use: Yes    Alcohol/week: 1.0 standard drinks    Types: 1 Standard drinks or equivalent per week    Comment: occasionally  . Drug use: No    Review of Systems Per HPI unless specifically indicated above     Objective:    BP 132/64   Pulse (!) 59   Temp 98.4 F (36.9 C) (Oral)   Resp 16   Ht 5\' 10"  (1.778 m)   Wt (!) 406 lb (184.2 kg)   BMI 58.25 kg/m   Wt Readings from Last 3 Encounters:  08/13/18 (!) 406 lb (184.2 kg)  06/15/18 (!) 403 lb 11.2 oz (183.1  kg)  12/29/17 (!) 395 lb (179.2 kg)    Physical Exam  Constitutional: He is oriented to person, place, and time. He appears well-developed and well-nourished. No distress.  Well-appearing, comfortable, cooperative, morbid obesity  HENT:  Head: Normocephalic and atraumatic.  Mouth/Throat: Oropharynx is clear and moist.  Eyes: Conjunctivae are normal. Right eye exhibits no discharge. Left eye exhibits no discharge.  Neck: Normal range of motion. Neck supple. No thyromegaly present.  Cardiovascular: Normal rate, regular rhythm, normal heart sounds and intact distal pulses.  No murmur heard. Pulmonary/Chest: Effort normal and breath sounds normal. No respiratory distress. He has no wheezes. He has no rales.  Musculoskeletal: Normal range of motion. He exhibits no edema.  Lymphadenopathy:    He has no cervical adenopathy.  Neurological: He is alert and oriented to person, place, and time.  Skin: Skin is warm and dry. No rash noted. He is not diaphoretic. No erythema.  Psychiatric: He has a normal mood and affect. His behavior is normal.  Well groomed, good eye contact, normal speech and thoughts  Nursing note and vitals reviewed.  Results for orders placed or performed in visit on 06/15/18  Lipid panel  Result Value Ref Range   Cholesterol, Total 194 100 - 199 mg/dL   Triglycerides 122 0 - 149 mg/dL   HDL 43 >39 mg/dL   VLDL Cholesterol Cal 24 5 - 40 mg/dL   LDL Calculated 127 (H) 0 - 99 mg/dL   Chol/HDL Ratio 4.5 0.0 - 5.0 ratio  Glucose  Result Value Ref Range   Glucose 113 (H) 65 - 99 mg/dL      Assessment & Plan:   Problem List Items Addressed This Visit    GERD (gastroesophageal reflux disease)    Controlled on PPI Prior EGD by report Refill Omeprazole 20mg  daily      Relevant Medications   omeprazole (PRILOSEC) 20 MG capsule   Gout    Stable without acute flare Controlled on Allopurinol 300mg  daily Complication with Uric Acid nephrolithiasis on imaging L side x 2,  per Dr Yves Dill (2018) - Last uric acid level 4.9 (07/21/17)  Plan: Check uric acid today 1. Continue Allopurinol 300mg  daily for prophylaxis      Relevant Medications   allopurinol (ZYLOPRIM) 300 MG tablet   Other Relevant Orders   Uric acid   Hyperlipidemia    Only mild elevated LDL, limited lifestyle, not on statin Last lipid panel 05/2018 through employer Calculated ASCVD 10 yr risk score 6.4%  Plan: 1. Continue current meds - Fish Oil 2400 BID - not meet criteria for statin at this time 2. Continue ASA 81mg  for primary ASCVD risk reduction 3. Encourage improved lifestyle - low carb/cholesterol, reduce portion size, continue improving regular exercise      Relevant Medications   telmisartan (MICARDIS)  80 MG tablet   metoprolol succinate (TOPROL-XL) 50 MG 24 hr tablet   Other Relevant Orders   TSH   T4, free   Hypertension - Primary    Controlled HTN - Home BP readings not available today  No known complications - Failed Lisinopril-HCTZ - side effects     Plan:  1. Continue current BP regimen - Metoprolol XL 50mg  daily (sent new rx for XL, not CS24), Telmisartan 80mg  daily 2. Encourage improved lifestyle - low sodium diet, improve regular exercise 3. Continue to monitor BP outside office, bring readings to next visit, if persistently >140/90 or new symptoms notify office sooner 4. Follow-up 6 months for annual and labs  Check labs today chemistry etc      Relevant Medications   telmisartan (MICARDIS) 80 MG tablet   metoprolol succinate (TOPROL-XL) 50 MG 24 hr tablet   Other Relevant Orders   COMPLETE METABOLIC PANEL WITH GFR   Morbid obesity with BMI of 50.0-59.9, adult (HCC)    Still abnormal weight gain Poor lifestyle Secondary HTN, OSA  Check labs, screening chemistry, A1c, TSH  Again encouraged lifestyle improvements in detail and offered medication options - defer Phentermine he is not interested, would pursue Contrave vs GLP1 agent for now, he can check  cost - Also gave handout contact info for [facility] vs future bariatric      Relevant Orders   CBC with Differential/Platelet   COMPLETE METABOLIC PANEL WITH GFR   OSA treated with BiPAP    Well controlled, chronic OSA on BiPAP, now >13 years, had initial PSG, not aware of results or records if available - Good adherence to BiPAP nightly - Continue current BiPAP therapy, patient seems to be benefiting from therapy - In future if needs update will consider referral to Pulmonology given morbid obesity likely factor and on BiPAP      Uric acid nephrolithiasis    Known Gout history Complication with Uric Acid nephrolithiasis on imaging L side x 2, per Dr Yves Dill (2018) - Last uric acid level 4.9 (07/21/17)  Plan: Check Uric Acid level today for monitoring 1. Continue Allopurinol 300mg  daily for prophylaxis 2. Advised to follow-up with Urology regarding Uric Acid Nephrolithiasis - future would consider dissolving by alkalinzing urine with K-citrate 10-43mEq BID      Relevant Medications   allopurinol (ZYLOPRIM) 300 MG tablet   Other Relevant Orders   Uric acid    Other Visit Diagnoses    Abnormal glucose       Relevant Orders   Hemoglobin A1c   Screening for HIV (human immunodeficiency virus)       Relevant Orders   HIV antibody   Screening for prostate cancer       Relevant Orders   PSA      Meds ordered this encounter  Medications  . telmisartan (MICARDIS) 80 MG tablet    Sig: Take 1 tablet (80 mg total) by mouth at bedtime.    Dispense:  90 tablet    Refill:  3  . metoprolol succinate (TOPROL-XL) 50 MG 24 hr tablet    Sig: Take 1 tablet (50 mg total) by mouth daily. Take with or immediately following a meal.    Dispense:  90 tablet    Refill:  3    Please replace prior rx CS24, keep on file until due for next fill  . omeprazole (PRILOSEC) 20 MG capsule    Sig: Take 1 capsule (20 mg total) by mouth daily.  Dispense:  90 capsule    Refill:  3  .  allopurinol (ZYLOPRIM) 300 MG tablet    Sig: Take 1 tablet (300 mg total) by mouth daily.    Dispense:  90 tablet    Refill:  3    Follow up plan: Return in about 6 months (around 02/13/2019) for Annual Physical.  After lab review from current orders, will then place orders in future for 02/09/19  Nobie Putnam, [facility] 08/13/2018, 12:46 PM

## 2018-08-13 NOTE — Assessment & Plan Note (Signed)
Stable without acute flare Controlled on Allopurinol 300mg  daily Complication with Uric Acid nephrolithiasis on imaging L side x 2, per Dr Yves Dill (2018) - Last uric acid level 4.9 (07/21/17)  Plan: Check uric acid today 1. Continue Allopurinol 300mg  daily for prophylaxis

## 2018-08-13 NOTE — Patient Instructions (Addendum)
Thank you for coming to the office today.  Refilled all medicines today 90 day supply  Ordered additional labs today - stay tuned for results on mychart.  Due for Flu Shot this season - please send or bring [place] a copy of your documentation  Try to improve lifestyle as discussed for weight loss  Dr Dennard Nip [facility] [place], [place] [address] Ph: [phone]  Call insurance find cost and coverage of the following  Ask about - Contrave to see if covered and cost.  1. Ozempic (Semaglutide injection) - start 0.33m weekly for 4 weeks then increase to 0.557mweekly - This one has best benefit of weight loss and reducing Cardiovascular events  2. Bydureon BCise (Exenatide ER) - once weekly - this is my preference, very good medicine well tolerated, less side effects of nausea, upset stomach. No dose changes. Cost and coverage is the problem, but we may be able to get it with the coupon card  3. Trulicity (Dulaglutide) - once weekly - this is very good one, usually one of my top choices as well, two doses, 0.75 (likely we would start) and 1.5 max dose. We can use coupon card here too  4. Victoza (Liraglutide) - once DAILY - 3 dose changes 0.6, 1.2 and 1.8, side effects nausea, upset stomach higher on this one but it is still very effective medicine  Colon Cancer Screening: - For all adults age 166+outine colon cancer screening is highly recommended.     - [place] 53 Early detection of colon cancer is important, because often there are no warning signs or symptoms, also if found early usually it can be cured. Late stage is hard to treat.  - If you are not interested in Colonoscopy screening (if done and normal you could be cleared for 5 to 10 years until next due), then Cologuard is an excellent alternative for screening test for Colon Cancer. It is highly sensitive for  detecting DNA of colon cancer from even the earliest stages. Also, there is NO bowel prep required. - If Cologuard is NEGATIVE, then it is good for 3 years before next due - If Cologuard is POSITIVE, then it is strongly advised to get a Colonoscopy, which allows the GI doctor to locate the source of the cancer or polyp (even very early stage) and treat it by removing it. ------------------------- If you would like to proceed with Cologuard (stool DNA test) - FIRST, call your insurance company and tell them you want to check cost of Cologuard tell them CPT Code [id number] may be completely covered and you could get for no cost, OR max cost without any coverage is about $600). Also, keep in mind if you do NOT open the kit, and decide not to do the test, you will NOT be charged, you should contact the company if you decide not to do the test. - If you want to proceed, you can notify usKoreaphone message, MyTeaticketor at next visit) and we will order it for you. The test kit will be delivered to you house within about 1 week. Follow instructions to collect sample, you may call the company for any help or questions, 24/7 telephone support at [phone] DUE for FASTING BLOOD WORK (no food or drink after midnight before the lab appointment, only water or coffee without cream/sugar on the morning of)  SCHEDULE "Lab  Only" visit in the morning at the clinic for lab draw in 6 MONTHS   - Make sure Lab Only appointment is at about 1 week before your next appointment, so that results will be available  For Lab Results, once available within 2-3 days of blood draw, you can can log in to MyChart online to view your results and a brief explanation. Also, we can discuss results at next follow-up visit.   Please schedule a Follow-up Appointment to: Return in about 6 months (around 02/13/2019) for Annual Physical.  If you have any other questions or concerns, please feel free to call the office or send a  message through [place]. You may also schedule an earlier appointment if necessary.  Additionally, you may be receiving a survey about your experience at our office within a few days to 1 week by e-mail or mail. We value your feedback.  Nobie Putnam, DO [place]

## 2018-08-13 NOTE — Assessment & Plan Note (Addendum)
Still abnormal weight gain Poor lifestyle Secondary HTN, OSA  Check labs, screening chemistry, A1c, TSH  Again encouraged lifestyle improvements in detail and offered medication options - defer Phentermine he is not interested, would pursue Contrave vs GLP1 agent for now, he can check cost - Also gave handout contact info for [facility] vs future bariatric

## 2018-08-13 NOTE — Assessment & Plan Note (Signed)
Controlled on PPI Prior EGD by report Refill Omeprazole 20mg  daily

## 2018-08-13 NOTE — Assessment & Plan Note (Signed)
Known Gout history Complication with Uric Acid nephrolithiasis on imaging L side x 2, per Dr Yves Dill (2018) - Last uric acid level 4.9 (07/21/17)  Plan: Check Uric Acid level today for monitoring 1. Continue Allopurinol 300mg  daily for prophylaxis 2. Advised to follow-up with Urology regarding Uric Acid Nephrolithiasis - future would consider dissolving by alkalinzing urine with K-citrate 10-69mEq BID

## 2018-08-13 NOTE — Assessment & Plan Note (Signed)
Well controlled, chronic OSA on BiPAP, now >13 years, had initial PSG, not aware of results or records if available - Good adherence to BiPAP nightly - Continue current BiPAP therapy, patient seems to be benefiting from therapy - In future if needs update will consider referral to Pulmonology given morbid obesity likely factor and on BiPAP

## 2018-08-13 NOTE — Assessment & Plan Note (Signed)
Controlled HTN - Home BP readings not available today  No known complications - Failed Lisinopril-HCTZ - side effects     Plan:  1. Continue current BP regimen - Metoprolol XL 50mg  daily (sent new rx for XL, not CS24), Telmisartan 80mg  daily 2. Encourage improved lifestyle - low sodium diet, improve regular exercise 3. Continue to monitor BP outside office, bring readings to next visit, if persistently >140/90 or new symptoms notify office sooner 4. Follow-up 6 months for annual and labs  Check labs today chemistry etc

## 2018-08-13 NOTE — Assessment & Plan Note (Signed)
Only mild elevated LDL, limited lifestyle, not on statin Last lipid panel 05/2018 through employer Calculated ASCVD 10 yr risk score 6.4%  Plan: 1. Continue current meds - Fish Oil 2400 BID - not meet criteria for statin at this time 2. Continue ASA 81mg  for primary ASCVD risk reduction 3. Encourage improved lifestyle - low carb/cholesterol, reduce portion size, continue improving regular exercise

## 2018-08-14 LAB — COMPLETE METABOLIC PANEL WITH GFR
AG RATIO: 2 (calc) (ref 1.0–2.5)
ALKALINE PHOSPHATASE (APISO): 103 U/L (ref 40–115)
ALT: 90 U/L — ABNORMAL HIGH (ref 9–46)
AST: 49 U/L — AB (ref 10–35)
Albumin: 4.4 g/dL (ref 3.6–5.1)
BILIRUBIN TOTAL: 1.1 mg/dL (ref 0.2–1.2)
BUN: 14 mg/dL (ref 7–25)
CHLORIDE: 107 mmol/L (ref 98–110)
CO2: 28 mmol/L (ref 20–32)
Calcium: 9.4 mg/dL (ref 8.6–10.3)
Creat: 1.01 mg/dL (ref 0.70–1.33)
GFR, Est African American: 98 mL/min/{1.73_m2} (ref >=60)
GFR, Est Non African American: 85 mL/min/{1.73_m2} (ref >=60)
Globulin: 2.2 g/dL (calc) (ref 1.9–3.7)
Glucose, Bld: 128 mg/dL — ABNORMAL HIGH (ref 65–99)
POTASSIUM: 4.3 mmol/L (ref 3.5–5.3)
SODIUM: 142 mmol/L (ref 135–146)
Total Protein: 6.6 g/dL (ref 6.1–8.1)

## 2018-08-14 LAB — TSH: TSH: 1.13 mIU/L (ref 0.40–4.50)

## 2018-08-14 LAB — CBC WITH DIFFERENTIAL/PLATELET
BASOS PCT: 0.2 %
Basophils Absolute: 9 cells/uL (ref 0–200)
EOS ABS: 39 {cells}/uL (ref 15–500)
Eosinophils Relative: 0.9 %
HCT: 46 % (ref 38.5–50.0)
Hemoglobin: 15.9 g/dL (ref 13.2–17.1)
Lymphs Abs: 1260 cells/uL (ref 850–3900)
MCH: 31.9 pg (ref 27.0–33.0)
MCHC: 34.6 g/dL (ref 32.0–36.0)
MCV: 92.2 fL (ref 80.0–100.0)
MPV: 10.3 fL (ref 7.5–12.5)
Monocytes Relative: 9.4 %
NEUTROS PCT: 60.2 %
Neutro Abs: 2589 cells/uL (ref 1500–7800)
PLATELETS: 151 10*3/uL (ref 140–400)
RBC: 4.99 10*6/uL (ref 4.20–5.80)
RDW: 13.2 % (ref 11.0–15.0)
TOTAL LYMPHOCYTE: 29.3 %
WBC: 4.3 10*3/uL (ref 3.8–10.8)
WBCMIX: 404 {cells}/uL (ref 200–950)

## 2018-08-14 LAB — HEMOGLOBIN A1C
HEMOGLOBIN A1C: 5.8 %{Hb} — AB (ref <5.7)
Mean Plasma Glucose: 120 (calc)
eAG (mmol/L): 6.6 (calc)

## 2018-08-14 LAB — T4, FREE: Free T4: 1.4 ng/dL (ref 0.8–1.8)

## 2018-08-14 LAB — PSA: PSA: 0.5 ng/mL (ref <=4.0)

## 2018-08-14 LAB — HIV ANTIBODY (ROUTINE TESTING W REFLEX): HIV 1&2 Ab, 4th Generation: NONREACTIVE

## 2018-08-14 LAB — URIC ACID: URIC ACID, SERUM: 5.2 mg/dL (ref 4.0–8.0)

## 2018-08-18 ENCOUNTER — Encounter: Payer: Self-pay | Admitting: Family Medicine

## 2018-08-18 ENCOUNTER — Other Ambulatory Visit: Payer: Self-pay | Admitting: Family Medicine

## 2018-08-18 DIAGNOSIS — R7989 Other specified abnormal findings of blood chemistry: Secondary | ICD-10-CM

## 2018-08-18 DIAGNOSIS — Z6841 Body Mass Index (BMI) 40.0 and over, adult: Secondary | ICD-10-CM

## 2018-08-18 DIAGNOSIS — E782 Mixed hyperlipidemia: Secondary | ICD-10-CM

## 2018-08-18 DIAGNOSIS — N200 Calculus of kidney: Secondary | ICD-10-CM

## 2018-08-18 DIAGNOSIS — R7309 Other abnormal glucose: Secondary | ICD-10-CM | POA: Insufficient documentation

## 2018-08-18 DIAGNOSIS — G4733 Obstructive sleep apnea (adult) (pediatric): Secondary | ICD-10-CM

## 2018-08-18 DIAGNOSIS — M1A09X0 Idiopathic chronic gout, multiple sites, without tophus (tophi): Secondary | ICD-10-CM

## 2018-08-18 DIAGNOSIS — I10 Essential (primary) hypertension: Secondary | ICD-10-CM

## 2018-08-18 DIAGNOSIS — Z125 Encounter for screening for malignant neoplasm of prostate: Secondary | ICD-10-CM

## 2018-08-18 DIAGNOSIS — Z0000 Encounter for general adult medical examination without abnormal findings: Secondary | ICD-10-CM

## 2018-08-18 DIAGNOSIS — R945 Abnormal results of liver function studies: Secondary | ICD-10-CM

## 2018-08-18 DIAGNOSIS — E559 Vitamin D deficiency, unspecified: Secondary | ICD-10-CM

## 2018-09-13 ENCOUNTER — Ambulatory Visit (INDEPENDENT_AMBULATORY_CARE_PROVIDER_SITE_OTHER): Payer: Managed Care, Other (non HMO)

## 2018-09-13 ENCOUNTER — Ambulatory Visit: Payer: Managed Care, Other (non HMO)

## 2018-09-13 ENCOUNTER — Other Ambulatory Visit: Payer: Self-pay

## 2018-09-13 ENCOUNTER — Ambulatory Visit
Admission: EM | Admit: 2018-09-13 | Discharge: 2018-09-13 | Disposition: A | Discharge status: Home / Self Care | Payer: Managed Care, Other (non HMO) | Attending: Emergency Medicine | Admitting: Emergency Medicine

## 2018-09-13 DIAGNOSIS — S139XXA Sprain of joints and ligaments of unspecified parts of neck, initial encounter: Secondary | ICD-10-CM

## 2018-09-13 DIAGNOSIS — M545 Low back pain: Secondary | ICD-10-CM | POA: Diagnosis not present

## 2018-09-13 DIAGNOSIS — W28XXXA Contact with powered lawn mower, initial encounter: Secondary | ICD-10-CM

## 2018-09-13 DIAGNOSIS — Z6841 Body Mass Index (BMI) 40.0 and over, adult: Secondary | ICD-10-CM

## 2018-09-13 DIAGNOSIS — S32048A Other fracture of fourth lumbar vertebra, initial encounter for closed fracture: Secondary | ICD-10-CM

## 2018-09-13 DIAGNOSIS — M542 Cervicalgia: Secondary | ICD-10-CM | POA: Diagnosis not present

## 2018-09-13 MED ORDER — KETOROLAC TROMETHAMINE 60 MG/2ML IM SOLN
60.0000 mg | Freq: Once | INTRAMUSCULAR | Status: AC
  Administered 2018-09-13: 60 mg via INTRAMUSCULAR

## 2018-09-13 MED ORDER — MELOXICAM 15 MG PO TABS: 15.0000 mg | ORAL_TABLET | Freq: Every day | ORAL | 0 refills | Status: DC
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
## 2018-09-13 NOTE — Discharge Instructions (Signed)
Avoid symptoms and rest.  Follow up with Dr. Lysle Morales tomorrow at his office in [facility]

## 2018-09-13 NOTE — ED Triage Notes (Signed)
Patient complains of lower back pain and neck pain. Patient states that he flipped his zero turn mower on Saturday and it folded his legs to chest. Patient states that he does have a knot on the back of his head. Patient states that he has been trying muscle relaxers that he had left over from 3-4 years ago.

## 2018-09-27 ENCOUNTER — Ambulatory Visit (INDEPENDENT_AMBULATORY_CARE_PROVIDER_SITE_OTHER): Payer: Managed Care, Other (non HMO)

## 2018-09-27 ENCOUNTER — Encounter: Payer: Self-pay | Admitting: Emergency Medicine

## 2018-09-27 ENCOUNTER — Other Ambulatory Visit: Payer: Self-pay

## 2018-09-27 ENCOUNTER — Ambulatory Visit
Admission: EM | Admit: 2018-09-27 | Discharge: 2018-09-27 | Disposition: A | Discharge status: Home / Self Care | Payer: Managed Care, Other (non HMO) | Attending: Family Medicine | Admitting: Family Medicine

## 2018-09-27 DIAGNOSIS — R69 Illness, unspecified: Secondary | ICD-10-CM

## 2018-09-27 DIAGNOSIS — R509 Fever, unspecified: Secondary | ICD-10-CM | POA: Diagnosis not present

## 2018-09-27 LAB — RAPID INFLUENZA A&B ANTIGENS
Influenza A (ARMC): NEGATIVE
Influenza B (ARMC): NEGATIVE

## 2018-09-27 MED ORDER — OSELTAMIVIR PHOSPHATE 75 MG PO CAPS: 75.0000 mg | ORAL_CAPSULE | Freq: Two times a day (BID) | ORAL | 0 refills | Status: DC
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
## 2018-09-27 NOTE — ED Triage Notes (Signed)
Pt c/o chill, sweats, fatigue and headache. Loss of appetite.  No URI symptoms.

## 2018-09-27 NOTE — Discharge Instructions (Signed)
Rest. Fluids.  Tamiflu if desired.  Take care  Dr. Lacinda Axon

## 2018-09-29 ENCOUNTER — Telehealth: Payer: Self-pay | Admitting: Family Medicine

## 2018-09-29 NOTE — Telephone Encounter (Signed)
Patient called regarding recent visit and flu like Sx's Rx Temiflu which started taking from Monday but low grade fever is still not improved and cough and HA has improved. As per patient has Hx of pneumonia in past and fracture L4 in past 2 weeks. He think he might have infection since it's still not improved and would appointment and blood work. Please suggest.

## 2018-09-29 NOTE — Telephone Encounter (Signed)
Discussed with Nisha as per below. He would need to be seen to discuss alternative treatment option. Possibly if ultimately it is not viral/influenza then febrile illness may be other etiology. Possibly bacterial or other infection.  Goal would be to localize fever based on symptoms / exam now.  He had CXR that was negative, but with history of pneumonia, may still be possibility and not identified yet on chest x-ray.  I am not exactly sure what blood work he is requesting. Could consider CBC, but otherwise less likely to proceed with outpatient blood work up for fever if such acute onset only.  He should follow-up by end of week if needed, return precautions should be provided.  Nobie Putnam, [facility] 09/29/2018, 5:54 PM

## 2018-09-29 NOTE — Telephone Encounter (Signed)
Pt said he went to [facility] they are treating him for the flu ( test was neg. Per pt) he said he still did not feel good was thinking he had infection and wanted someone to call him.Pt call back # is [phone]

## 2018-09-30 ENCOUNTER — Ambulatory Visit (INDEPENDENT_AMBULATORY_CARE_PROVIDER_SITE_OTHER): Payer: Managed Care, Other (non HMO) | Admitting: Nurse Practitioner

## 2018-09-30 ENCOUNTER — Encounter: Payer: Self-pay | Admitting: Nurse Practitioner

## 2018-09-30 ENCOUNTER — Other Ambulatory Visit: Payer: Self-pay

## 2018-09-30 VITALS — BP 140/74 | HR 65 | Temp 98.2°F | Ht 71.0 in | Wt 394.0 lb

## 2018-09-30 DIAGNOSIS — R6883 Chills (without fever): Secondary | ICD-10-CM | POA: Diagnosis not present

## 2018-09-30 DIAGNOSIS — R61 Generalized hyperhidrosis: Secondary | ICD-10-CM

## 2018-09-30 LAB — POCT URINALYSIS DIPSTICK
Bilirubin, UA: NEGATIVE
Blood, UA: NEGATIVE
Glucose, UA: NEGATIVE
Ketones, UA: NEGATIVE
Leukocytes, UA: NEGATIVE
Nitrite, UA: NEGATIVE
Protein, UA: NEGATIVE
Spec Grav, UA: 1.015 (ref 1.010–1.025)
Urobilinogen, UA: 0.2 E.U./dL
pH, UA: 5 (ref 5.0–8.0)

## 2018-09-30 NOTE — Progress Notes (Signed)
Subjective:    Patient ID: Scott Sanford, male    DOB: [birth year], 52 y.o.   MRN: [id number]  Scott Sanford is a 52 y.o. male presenting on 09/30/2018 for Back Pain (fracture L4 from a lawnmower falling over on him. x 2 weeks ago) and Chills (chills, pt reports fever,but has not checked his temp. x  4 days. Diagnose w/ the flu but patient been taking the tamiflu with no improvement x 1 week.  )   HPI Followup Urgent Care visit Chills, back pain.  Treated for influenza with Tamiflu.  Tamiflu has helped   Sweaty/hot or chills.  No headaches or other symptoms.  No cough, congestion.  No trouble with urination since Sunday.  Has had more cloudy urine, but has not felt good to drink or eat. Denies headache except Mon/Tues after taking Tamilflu.  No headache today.  No leg weakness, no radicular pain.  Pain level at this time is 1-2/10, down from 4/10 after initial spine injury.  - Patient admits his neurosurgeon instructed him to call if he developed fevers after discharge related to his spinal compression fracture  Social History   Tobacco Use  . Smoking status: Never Smoker  . Smokeless tobacco: Never Used  Substance Use Topics  . Alcohol use: Yes    Alcohol/week: 1.0 standard drinks    Types: 1 Standard drinks or equivalent per week    Comment: occasionally  . Drug use: No    Review of Systems Per HPI unless specifically indicated above     Objective:    BP 140/74 (BP Location: Right Arm, Patient Position: Sitting, Cuff Size: Normal)   Pulse 65   Temp 98.2 F (36.8 C) (Oral)   Ht 5\' 11"  (1.803 m)   Wt (!) 394 lb (178.7 kg)   BMI 54.95 kg/m   Wt Readings from Last 3 Encounters:  09/30/18 (!) 394 lb (178.7 kg)  09/27/18 (!) 400 lb (181.4 kg)  09/13/18 (!) 400 lb (181.4 kg)    Physical Exam  Constitutional: He is oriented to person, place, and time. He appears well-developed and well-nourished. No distress.  HENT:  Head: Normocephalic and atraumatic.  Cardiovascular:  Normal rate, regular rhythm, S1 normal, S2 normal, normal heart sounds and intact distal pulses.  Pulmonary/Chest: Effort normal and breath sounds normal. No respiratory distress.  Musculoskeletal: Normal range of motion. He exhibits no edema or tenderness.  Mild paraspinal muscle hypertonicity.    Neurological: He is alert and oriented to person, place, and time.  Skin: Skin is warm and dry.  Psychiatric: He has a normal mood and affect. His behavior is normal.  Vitals reviewed.   Results for orders placed or performed during the hospital encounter of 09/27/18  Rapid Influenza A&B Antigens ([facility] only)  Result Value Ref Range   Influenza A ([facility]) NEGATIVE NEGATIVE   Influenza B ([facility]) NEGATIVE NEGATIVE      Assessment & Plan:   Problem List Items Addressed This Visit    None    Visit Diagnoses    Chills without fever    -  Primary   Relevant Orders   POCT Urinalysis Dipstick   CBC with Differential/Platelet   COMPLETE METABOLIC PANEL WITH GFR   Diaphoresis       Relevant Orders   CBC with Differential/Platelet   COMPLETE METABOLIC PANEL WITH GFR    Patient with chills, no fever, uncontrollable and unresolving diaphoresis.  Likely could be spinal complication in absence of any identifiable  infection.    Plan: 1. Contact neurosurgeon for fever without known infectious cause. 2. May take tylenol for fevers, go to ER of not resolving fever > 101 deg F. 3. Follow-up prn in next 5-7 days if no improvement and after calling neurosurgeon.  Follow up plan: Return  5-7 days if symptoms worsen or fail to improve.  Cassell Smiles, DNP, AGPCNP-BC Adult Gerontology Primary Care Nurse Practitioner [facility] 09/30/2018, 10:06 AM

## 2018-09-30 NOTE — Patient Instructions (Addendum)
Kerry Dory "Coley",   Thank you for coming in to clinic today.  1. No known source of infection.  2. Labs today.  3. If chills and sweats continue, I recommend following up with neurosurgeon. - You may have neurological reaction to your fracture, but this is again unlikely. - Definitely let us know if you start having fever > 101 degrees F  Please schedule a follow-up appointment with Cassell Smiles, AGNP. Return  5-7 days if symptoms worsen or fail to improve.  If you have any other questions or concerns, please feel free to call the clinic or send a message through [place]. You may also schedule an earlier appointment if necessary.  You will receive a survey after today's visit either digitally by e-mail or paper by [employer]. Your experiences and feedback matter to [place].  Please respond so we know how we are doing as we provide care for you.   Cassell Smiles, DNP, AGNP-BC Adult Gerontology Nurse Practitioner [place]

## 2018-10-01 LAB — COMPLETE METABOLIC PANEL WITH GFR
AG Ratio: 1.6 (calc) (ref 1.0–2.5)
ALT: 55 U/L — ABNORMAL HIGH (ref 9–46)
AST: 39 U/L — ABNORMAL HIGH (ref 10–35)
Albumin: 4 g/dL (ref 3.6–5.1)
Alkaline phosphatase (APISO): 171 U/L — ABNORMAL HIGH (ref 40–115)
BUN: 16 mg/dL (ref 7–25)
CO2: 28 mmol/L (ref 20–32)
Calcium: 9.3 mg/dL (ref 8.6–10.3)
Chloride: 103 mmol/L (ref 98–110)
Creat: 0.88 mg/dL (ref 0.70–1.33)
GFR, Est African American: 114 mL/min/{1.73_m2} (ref >=60)
GFR, Est Non African American: 98 mL/min/{1.73_m2} (ref >=60)
Globulin: 2.5 g/dL (calc) (ref 1.9–3.7)
Glucose, Bld: 116 mg/dL — ABNORMAL HIGH (ref 65–99)
Potassium: 4 mmol/L (ref 3.5–5.3)
Sodium: 139 mmol/L (ref 135–146)
Total Bilirubin: 0.6 mg/dL (ref 0.2–1.2)
Total Protein: 6.5 g/dL (ref 6.1–8.1)

## 2018-10-01 LAB — CBC WITH DIFFERENTIAL/PLATELET
Basophils Absolute: 12 cells/uL (ref 0–200)
Basophils Relative: 0.3 %
Eosinophils Absolute: 62 cells/uL (ref 15–500)
Eosinophils Relative: 1.6 %
HCT: 43.9 % (ref 38.5–50.0)
Hemoglobin: 15 g/dL (ref 13.2–17.1)
Lymphs Abs: 1119 cells/uL (ref 850–3900)
MCH: 31.2 pg (ref 27.0–33.0)
MCHC: 34.2 g/dL (ref 32.0–36.0)
MCV: 91.3 fL (ref 80.0–100.0)
MPV: 9.4 fL (ref 7.5–12.5)
Monocytes Relative: 12.9 %
Neutro Abs: 2204 cells/uL (ref 1500–7800)
Neutrophils Relative %: 56.5 %
Platelets: 234 10*3/uL (ref 140–400)
RBC: 4.81 10*6/uL (ref 4.20–5.80)
RDW: 12.6 % (ref 11.0–15.0)
Total Lymphocyte: 28.7 %
WBC mixed population: 503 cells/uL (ref 200–950)
WBC: 3.9 10*3/uL (ref 3.8–10.8)

## 2018-10-13 ENCOUNTER — Telehealth: Payer: Self-pay

## 2018-10-13 MED ORDER — MELOXICAM 15 MG PO TABS: 15.0000 mg | ORAL_TABLET | Freq: Every day | ORAL | 0 refills | Status: DC | PRN

## 2018-10-13 NOTE — Telephone Encounter (Signed)
Refilled Meloxicam temporary 30 day supply only take if needed for pain. He should follow-up with [place] as planned.  Nobie Putnam, [facility] 10/13/2018, 12:55 PM

## 2018-10-13 NOTE — Telephone Encounter (Signed)
Patient came by and is requesting a refill of  meloxicam 15mg  1 tablet daily to [facility].  He was seen by urgent care and they gave him this medication dn he is currently out.    Has appointment with Dr. Nyoka Cowden at [place] [facility] orthopedic is sometime in November.  Please advise

## 2018-10-14 NOTE — Telephone Encounter (Signed)
Left detail message. 

## 2018-11-03 ENCOUNTER — Other Ambulatory Visit: Payer: Self-pay | Admitting: Student

## 2018-11-03 DIAGNOSIS — S32040D Wedge compression fracture of fourth lumbar vertebra, subsequent encounter for fracture with routine healing: Secondary | ICD-10-CM

## 2018-11-09 ENCOUNTER — Ambulatory Visit: Payer: Managed Care, Other (non HMO)

## 2018-11-21 ENCOUNTER — Encounter: Payer: Self-pay | Admitting: Nurse Practitioner

## 2019-02-09 ENCOUNTER — Other Ambulatory Visit: Payer: Managed Care, Other (non HMO)

## 2019-02-14 ENCOUNTER — Encounter: Payer: Managed Care, Other (non HMO) | Admitting: Family Medicine

## 2019-03-23 ENCOUNTER — Other Ambulatory Visit: Payer: Managed Care, Other (non HMO)

## 2019-03-25 ENCOUNTER — Encounter: Payer: Managed Care, Other (non HMO) | Admitting: Family Medicine

## 2019-06-19 ENCOUNTER — Other Ambulatory Visit: Payer: Self-pay | Admitting: Family Medicine

## 2019-06-19 DIAGNOSIS — I10 Essential (primary) hypertension: Secondary | ICD-10-CM

## 2019-06-22 ENCOUNTER — Telehealth: Payer: Self-pay

## 2019-06-22 ENCOUNTER — Other Ambulatory Visit: Payer: Self-pay | Admitting: Family Medicine

## 2019-06-22 DIAGNOSIS — R7309 Other abnormal glucose: Secondary | ICD-10-CM

## 2019-06-22 DIAGNOSIS — Z6841 Body Mass Index (BMI) 40.0 and over, adult: Secondary | ICD-10-CM

## 2019-06-22 DIAGNOSIS — E559 Vitamin D deficiency, unspecified: Secondary | ICD-10-CM

## 2019-06-22 DIAGNOSIS — E782 Mixed hyperlipidemia: Secondary | ICD-10-CM

## 2019-06-22 DIAGNOSIS — I10 Essential (primary) hypertension: Secondary | ICD-10-CM

## 2019-06-22 DIAGNOSIS — K219 Gastro-esophageal reflux disease without esophagitis: Secondary | ICD-10-CM

## 2019-06-22 DIAGNOSIS — Z0000 Encounter for general adult medical examination without abnormal findings: Secondary | ICD-10-CM

## 2019-06-22 NOTE — Telephone Encounter (Signed)
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

## 2019-06-22 NOTE — Telephone Encounter (Signed)
Patient R/S his next weeks appointment to 09/02/19 but wants to make sure lab work for physical can go to [place] and if needed then fax number is [phone].

## 2019-06-22 NOTE — Telephone Encounter (Signed)
This is fine. I have placed orders for [facility] physical labs and signed and released them.  Could you contact [facility] to see if we can fax them lab orders now for this future lab draw? Or if just leave them as released and active in system from now until September. Or he can come by to pick up orders.  Nobie Putnam, [facility] 06/22/2019, 12:18 PM

## 2019-06-27 ENCOUNTER — Other Ambulatory Visit: Payer: Managed Care, Other (non HMO)

## 2019-07-01 ENCOUNTER — Encounter: Payer: Managed Care, Other (non HMO) | Admitting: Family Medicine

## 2019-08-26 ENCOUNTER — Other Ambulatory Visit: Payer: Self-pay | Admitting: Family Medicine

## 2019-08-26 DIAGNOSIS — I10 Essential (primary) hypertension: Secondary | ICD-10-CM

## 2019-08-26 DIAGNOSIS — M1A09X0 Idiopathic chronic gout, multiple sites, without tophus (tophi): Secondary | ICD-10-CM

## 2019-08-30 ENCOUNTER — Other Ambulatory Visit: Payer: Self-pay

## 2019-08-30 ENCOUNTER — Other Ambulatory Visit: Payer: Managed Care, Other (non HMO)

## 2019-08-30 DIAGNOSIS — Z0000 Encounter for general adult medical examination without abnormal findings: Secondary | ICD-10-CM | POA: Diagnosis not present

## 2019-08-31 LAB — COMPREHENSIVE METABOLIC PANEL
ALT: 51 IU/L — ABNORMAL HIGH (ref 0–44)
AST: 36 IU/L (ref 0–40)
Albumin/Globulin Ratio: 2 (ref 1.2–2.2)
Albumin: 4.5 g/dL (ref 3.8–4.9)
Alkaline Phosphatase: 116 IU/L (ref 39–117)
BUN/Creatinine Ratio: 14 (ref 9–20)
BUN: 14 mg/dL (ref 6–24)
Bilirubin Total: 0.7 mg/dL (ref 0.0–1.2)
CO2: 21 mmol/L (ref 20–29)
Calcium: 9.2 mg/dL (ref 8.7–10.2)
Chloride: 107 mmol/L — ABNORMAL HIGH (ref 96–106)
Creatinine, Ser: 0.97 mg/dL (ref 0.76–1.27)
GFR calc Af Amer: 102 mL/min/{1.73_m2} (ref >59)
GFR calc non Af Amer: 88 mL/min/{1.73_m2} (ref >59)
Globulin, Total: 2.3 g/dL (ref 1.5–4.5)
Glucose: 93 mg/dL (ref 65–99)
Potassium: 4.3 mmol/L (ref 3.5–5.2)
Sodium: 143 mmol/L (ref 134–144)
Total Protein: 6.8 g/dL (ref 6.0–8.5)

## 2019-08-31 LAB — CBC WITH DIFFERENTIAL/PLATELET
Basophils Absolute: 0 10*3/uL (ref 0.0–0.2)
Basos: 1 %
EOS (ABSOLUTE): 0.1 10*3/uL (ref 0.0–0.4)
Eos: 2 %
Hematocrit: 46.5 % (ref 37.5–51.0)
Hemoglobin: 16 g/dL (ref 13.0–17.7)
Immature Grans (Abs): 0 10*3/uL (ref 0.0–0.1)
Immature Granulocytes: 0 %
Lymphocytes Absolute: 1.4 10*3/uL (ref 0.7–3.1)
Lymphs: 36 %
MCH: 31.6 pg (ref 26.6–33.0)
MCHC: 34.4 g/dL (ref 31.5–35.7)
MCV: 92 fL (ref 79–97)
Monocytes Absolute: 0.4 10*3/uL (ref 0.1–0.9)
Monocytes: 9 %
Neutrophils Absolute: 2.1 10*3/uL (ref 1.4–7.0)
Neutrophils: 52 %
Platelets: 144 10*3/uL — ABNORMAL LOW (ref 150–450)
RBC: 5.07 x10E6/uL (ref 4.14–5.80)
RDW: 13 % (ref 11.6–15.4)
WBC: 3.9 10*3/uL (ref 3.4–10.8)

## 2019-08-31 LAB — LIPID PANEL
Chol/HDL Ratio: 4.5 ratio (ref 0.0–5.0)
Cholesterol, Total: 181 mg/dL (ref 100–199)
HDL: 40 mg/dL (ref >39)
LDL Chol Calc (NIH): 122 mg/dL — ABNORMAL HIGH (ref 0–99)
Triglycerides: 102 mg/dL (ref 0–149)
VLDL Cholesterol Cal: 19 mg/dL (ref 5–40)

## 2019-08-31 LAB — TSH: TSH: 2.19 u[IU]/mL (ref 0.450–4.500)

## 2019-08-31 LAB — VITAMIN D 25 HYDROXY (VIT D DEFICIENCY, FRACTURES): Vit D, 25-Hydroxy: 27.2 ng/mL — ABNORMAL LOW (ref 30.0–100.0)

## 2019-08-31 LAB — HGB A1C W/O EAG: Hgb A1c MFr Bld: 5.6 % (ref 4.8–5.6)

## 2019-08-31 LAB — PSA: Prostate Specific Ag, Serum: 0.9 ng/mL (ref 0.0–4.0)

## 2019-09-02 ENCOUNTER — Other Ambulatory Visit: Payer: Self-pay

## 2019-09-02 ENCOUNTER — Ambulatory Visit (INDEPENDENT_AMBULATORY_CARE_PROVIDER_SITE_OTHER): Payer: Managed Care, Other (non HMO) | Admitting: Family Medicine

## 2019-09-02 ENCOUNTER — Encounter: Payer: Self-pay | Admitting: Family Medicine

## 2019-09-02 VITALS — BP 139/70 | HR 53 | Temp 98.7°F | Resp 16 | Ht 71.0 in | Wt 379.0 lb

## 2019-09-02 DIAGNOSIS — R7309 Other abnormal glucose: Secondary | ICD-10-CM

## 2019-09-02 DIAGNOSIS — Z0000 Encounter for general adult medical examination without abnormal findings: Secondary | ICD-10-CM | POA: Diagnosis not present

## 2019-09-02 DIAGNOSIS — R945 Abnormal results of liver function studies: Secondary | ICD-10-CM

## 2019-09-02 DIAGNOSIS — G4733 Obstructive sleep apnea (adult) (pediatric): Secondary | ICD-10-CM

## 2019-09-02 DIAGNOSIS — Z23 Encounter for immunization: Secondary | ICD-10-CM | POA: Diagnosis not present

## 2019-09-02 DIAGNOSIS — I10 Essential (primary) hypertension: Secondary | ICD-10-CM

## 2019-09-02 DIAGNOSIS — R7989 Other specified abnormal findings of blood chemistry: Secondary | ICD-10-CM

## 2019-09-02 DIAGNOSIS — Z1211 Encounter for screening for malignant neoplasm of colon: Secondary | ICD-10-CM

## 2019-09-02 DIAGNOSIS — Z6841 Body Mass Index (BMI) 40.0 and over, adult: Secondary | ICD-10-CM

## 2019-09-02 DIAGNOSIS — E559 Vitamin D deficiency, unspecified: Secondary | ICD-10-CM

## 2019-09-02 DIAGNOSIS — E782 Mixed hyperlipidemia: Secondary | ICD-10-CM

## 2019-09-02 NOTE — Assessment & Plan Note (Signed)
Improving weight loss Secondary HTN, OSA

## 2019-09-02 NOTE — Assessment & Plan Note (Signed)
Stable, A1c 5.6, below range of PreDM Concern with obesity, HTN, HLD  Plan:  1. Not on any therapy currently  2. Encourage improved lifestyle - low carb, low sugar diet, reduce portion size, continue improving exercise 

## 2019-09-02 NOTE — Progress Notes (Signed)
Subjective:    Patient ID: Scott Sanford, male    DOB: 1966-03-10, 53 y.o.   MRN: [id number]  Scott Sanford is a 53 y.o. male presenting on 09/02/2019 for Annual Exam   HPI   Here for Annual Physical and Lab Review.  CHRONIC HTN: No new concerns Current Meds -Metoprolol XL 50mg  daily, Telmisartan 80mg  daily Reports good compliance, took meds today. Tolerating well, w/o complaints.  OSA, on BiPAP -  Previously seen by Pulmonology had PSG in 2006, then new machine in 2010, has been on BiPAP for long time now, doing well on it he has error message but machine still works well - Today he reports that sleep apnea is well controlled. He uses the BiPAP machine every night. Tolerates the machine well, and thinks that sleeps better with it and feels good. No new concerns or symptoms.  HYPERLIPIDEMIA  - Reports no concerns. Last lipid panel 08/2019 improved lipids - Currently taking Fish Oil 2400 BID, tolerating well without side effects or myalgias - Never on Statin - Taking ASA 81mg  daily  Morbid Obesity BMI >52 Interval improved weight loss. Diet: Reducing portion size Exercise: less active, limited walking  PMH - History of Gout/ Uric Acid Nephrolithiasis (Left) - on allopurinol, no recent flare, possible kidney stone recently since passed stone he believes as it resolved. Rarely takes a hydrocodone from prior stone episode.  Additional updates  He works at [employer] - there was major outbreak of [place] positive, most have already completed quarantine. He has been tested every week for 3 weeks, and this will likely continue for now.  Prior history back pain, [facility] by [facility] 01/2019 last, was going to do PT for piriformis syndrome, but cancelled d/t covid he has not returned yet. Takes advil PRN  Health Maintenance:  UTD Shingrix x 2 doses in 2018  Due Flu vaccine, will get today  Colon CA Screening: Never had colonoscopy. Now age 4, no prior  screening.Currently asymptomatic. No known family history of colon CA. Due for screening test, request Cologuard today  Prostate CA Screening:Prior PSA / DRE reported normal, Last PSA0.9(08/2019). Currently asymptomatic.No known family history of prostate CA.  Prior history of Pneumonia vaccine in 2006 after significant episode of pneumonia, asking about repeat.  Depression screen [facility] 2/9 09/02/2019 08/13/2018 12/29/2017  Decreased Interest 0 0 0  Down, Depressed, Hopeless 0 0 0  PHQ - 2 Score 0 0 0  Tired, decreased energy - - 1  Change in appetite - - 0  Feeling bad or failure about yourself  - - 0  Trouble concentrating - - 0  Moving slowly or fidgety/restless - - 0  Suicidal thoughts - - 0  Difficult doing work/chores - - Not difficult at all    Past Medical History:  Diagnosis Date  . 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 Sleep apnea    Past Surgical History:  Procedure Laterality Date  . CHOLECYSTECTOMY  2006   Gallstones  . TONSILLECTOMY  1984   Social History   Socioeconomic History  . Marital status: Divorced    Spouse name: Not on file  . Number of children: Not on file  . Years of education: College  . Highest education level: Bachelor's degree (e.g., BA, AB, BS)  Occupational History  . Occupation: [employer]  Social Needs  . Financial resource strain: Not on file  . Food insecurity  Worry: Not on file    Inability: Not on file  . Transportation needs    Medical: Not on file    Non-medical: Not on file  Tobacco Use  . Smoking status: Never Smoker  . Smokeless tobacco: Never Used  Substance and Sexual Activity  . Alcohol use: Yes    Alcohol/week: 1.0 standard drinks    Types: 1 Standard drinks or equivalent per week    Comment: occasionally  . Drug use: No  . Sexual activity: Not on file  Lifestyle  . Physical activity    Days per week: Not on file    Minutes per session: Not  on file  . Stress: Not on file  Relationships  . Social Herbalist on phone: Not on file    Gets together: Not on file    Attends religious service: Not on file    Active member of club or organization: Not on file    Attends meetings of clubs or organizations: Not on file    Relationship status: Not on file  . Intimate partner violence    Fear of current or ex partner: Not on file    Emotionally abused: Not on file    Physically abused: Not on file    Forced sexual activity: Not on file  Other Topics Concern  . Not on file  Social History Narrative  . Not on file   Family History  Problem Relation Age of Onset  . Lung cancer Mother        2015, apex of lung surgical removal  . Diabetes Father   . Dementia Paternal Grandmother   . Stroke Paternal Grandfather   . Prostate cancer Neg Hx   . Colon cancer Neg Hx    Current Outpatient Medications on File Prior to Visit  Medication Sig  . allopurinol (ZYLOPRIM) 300 MG tablet TAKE 1 TABLET BY MOUTH EVERY DAY  . aspirin 81 MG chewable tablet Chew by mouth daily.  Marland Kitchen doxycycline (ORACEA) 40 MG capsule Take by mouth.  . Ginkgo Biloba 40 MG TABS Take by mouth.  . metoprolol succinate (TOPROL-XL) 50 MG 24 hr tablet TAKE 1 TABLET (50 MG TOTAL) BY MOUTH DAILY. TAKE WITH OR IMMEDIATELY FOLLOWING A MEAL.  . Multiple Vitamin (MULTIVITAMIN) capsule Take 1 capsule by mouth daily.  . Omega-3 Fatty Acids (FISH OIL PO) Take 2,400 mg by mouth 2 (two) times daily.  Marland Kitchen omeprazole (PRILOSEC) 20 MG capsule TAKE 1 CAPSULE BY MOUTH EVERY DAY  . Probiotic Product (PROBIOTIC-10 PO) Take by mouth.  . telmisartan (MICARDIS) 80 MG tablet TAKE 1 TABLET BY MOUTH EVERYDAY AT BEDTIME  . cyclobenzaprine (FLEXERIL) 10 MG tablet TAKE 1 TABLET BY MOUTH THREE TIMES A DAY AS NEEDED FOR MUSCLE SPASMS   No current facility-administered medications on file prior to visit.     Review of Systems  Constitutional: Negative for activity change, appetite change,  chills, diaphoresis, fatigue and fever.  HENT: Negative for congestion and hearing loss.   Eyes: Negative for visual disturbance.  Respiratory: Negative for apnea, cough, chest tightness, shortness of breath and wheezing.   Cardiovascular: Negative for chest pain, palpitations and leg swelling.  Gastrointestinal: Negative for abdominal pain, anal bleeding, blood in stool, constipation, diarrhea, nausea and vomiting.  Endocrine: Negative for cold intolerance.  Genitourinary: Negative for difficulty urinating, dysuria, frequency and hematuria.  Musculoskeletal: Negative for arthralgias, back pain and neck pain.  Skin: Negative for rash.  Allergic/Immunologic: Negative for environmental allergies.  Neurological: Negative for dizziness, weakness, light-headedness, numbness and headaches.  Hematological: Negative for adenopathy.  Psychiatric/Behavioral: Negative for behavioral problems, dysphoric mood and sleep disturbance. The patient is not nervous/anxious.    Per HPI unless specifically indicated above      Objective:    BP 139/70   Pulse (!) 53   Temp 98.7 F (37.1 C) (Oral)   Resp 16   Ht 5\' 11"  (1.803 m)   Wt (!) 379 lb (171.9 kg)   BMI 52.86 kg/m   Wt Readings from Last 3 Encounters:  09/02/19 (!) 379 lb (171.9 kg)  09/30/18 (!) 394 lb (178.7 kg)  09/27/18 (!) 400 lb (181.4 kg)    Physical Exam Vitals signs and nursing note reviewed.  Constitutional:      General: He is not in acute distress.    Appearance: He is well-developed. He is not diaphoretic.     Comments: Well-appearing, comfortable, cooperative, morbid obesity  HENT:     Head: Normocephalic and atraumatic.     Right Ear: Tympanic membrane, ear canal and external ear normal.     Left Ear: Tympanic membrane, ear canal and external ear normal.     Ears:     Comments: Minimal dry cerumen, not impacted bilateral Eyes:     General:        Right eye: No discharge.        Left eye: No discharge.      Conjunctiva/sclera: Conjunctivae normal.     Pupils: Pupils are equal, round, and reactive to light.  Neck:     Musculoskeletal: Normal range of motion and neck supple.     Thyroid: No thyromegaly.     Comments: No carotid bruit Cardiovascular:     Rate and Rhythm: Normal rate and regular rhythm.     Heart sounds: Normal heart sounds. No murmur.  Pulmonary:     Effort: Pulmonary effort is normal. No respiratory distress.     Breath sounds: Normal breath sounds. No wheezing or rales.  Abdominal:     General: Bowel sounds are normal. There is no distension.     Palpations: Abdomen is soft. There is no mass.     Tenderness: There is no abdominal tenderness.  Musculoskeletal: Normal range of motion.        General: No tenderness.     Comments: Upper / Lower Extremities: - Normal muscle tone, strength bilateral upper extremities 5/5, lower extremities 5/5  Lymphadenopathy:     Cervical: No cervical adenopathy.  Skin:    General: Skin is warm and dry.     Findings: No erythema or rash.  Neurological:     Mental Status: He is alert and oriented to person, place, and time.     Comments: Distal sensation intact to light touch all extremities  Psychiatric:        Behavior: Behavior normal.     Comments: Well groomed, good eye contact, normal speech and thoughts        Results for orders placed or performed in visit on 08/30/19  Comprehensive metabolic panel  Result Value Ref Range   Glucose 93 65 - 99 mg/dL   BUN 14 6 - 24 mg/dL   Creatinine, Ser 0.97 0.76 - 1.27 mg/dL   GFR calc non Af Amer 88 >59 mL/min/1.73   GFR calc Af Amer 102 >59 mL/min/1.73   BUN/Creatinine Ratio 14 9 - 20   Sodium 143 134 - 144 mmol/L   Potassium 4.3 3.5 - 5.2 mmol/L  Chloride 107 (H) 96 - 106 mmol/L   CO2 21 20 - 29 mmol/L   Calcium 9.2 8.7 - 10.2 mg/dL   Total Protein 6.8 6.0 - 8.5 g/dL   Albumin 4.5 3.8 - 4.9 g/dL   Globulin, Total 2.3 1.5 - 4.5 g/dL   Albumin/Globulin Ratio 2.0 1.2 - 2.2    Bilirubin Total 0.7 0.0 - 1.2 mg/dL   Alkaline Phosphatase 116 39 - 117 IU/L   AST 36 0 - 40 IU/L   ALT 51 (H) 0 - 44 IU/L  CBC with Differential/Platelet  Result Value Ref Range   WBC 3.9 3.4 - 10.8 x10E3/uL   RBC 5.07 4.14 - 5.80 x10E6/uL   Hemoglobin 16.0 13.0 - 17.7 g/dL   Hematocrit 46.5 37.5 - 51.0 %   MCV 92 79 - 97 fL   MCH 31.6 26.6 - 33.0 pg   MCHC 34.4 31.5 - 35.7 g/dL   RDW 13.0 11.6 - 15.4 %   Platelets 144 (L) 150 - 450 x10E3/uL   Neutrophils 52 Not Estab. %   Lymphs 36 Not Estab. %   Monocytes 9 Not Estab. %   Eos 2 Not Estab. %   Basos 1 Not Estab. %   Neutrophils Absolute 2.1 1.4 - 7.0 x10E3/uL   Lymphocytes Absolute 1.4 0.7 - 3.1 x10E3/uL   Monocytes Absolute 0.4 0.1 - 0.9 x10E3/uL   EOS (ABSOLUTE) 0.1 0.0 - 0.4 x10E3/uL   Basophils Absolute 0.0 0.0 - 0.2 x10E3/uL   Immature Granulocytes 0 Not Estab. %   Immature Grans (Abs) 0.0 0.0 - 0.1 x10E3/uL  Hgb A1c w/o eAG  Result Value Ref Range   Hgb A1c MFr Bld 5.6 4.8 - 5.6 %  VITAMIN D 25 Hydroxy (Vit-D Deficiency, Fractures)  Result Value Ref Range   Vit D, 25-Hydroxy 27.2 (L) 30.0 - 100.0 ng/mL  Lipid panel  Result Value Ref Range   Cholesterol, Total 181 100 - 199 mg/dL   Triglycerides 102 0 - 149 mg/dL   HDL 40 >39 mg/dL   VLDL Cholesterol Cal 19 5 - 40 mg/dL   LDL Chol Calc (NIH) 122 (H) 0 - 99 mg/dL   Chol/HDL Ratio 4.5 0.0 - 5.0 ratio  TSH  Result Value Ref Range   TSH 2.190 0.450 - 4.500 uIU/mL  PSA  Result Value Ref Range   Prostate Specific Ag, Serum 0.9 0.0 - 4.0 ng/mL   Recent Labs    08/30/19 0912  HGBA1C 5.6       Assessment & Plan:   Problem List Items Addressed This Visit    Elevated hemoglobin A1c    Stable, A1c 5.6, below range of PreDM Concern with obesity, HTN, HLD  Plan:  1. Not on any therapy currently  2. Encourage improved lifestyle - low carb, low sugar diet, reduce portion size, continue improving exercise      Elevated LFTs    Improving now nearly at normal  level with wt loss and normalized cholesterol      Essential hypertension    Controlled HTN No known complications - Failed Lisinopril-HCTZ - side effects     Plan:  1. Continue current BP regimen - Metoprolol XL 50mg  daily, Telmisartan 80mg  daily 2. Encourage improved lifestyle - low sodium diet, improve regular exercise 3. Continue to monitor BP outside office, bring readings to next visit, if persistently >140/90 or new symptoms notify office sooner      Hyperlipidemia    Improved lipids, LDL Last lipid 08/2019 Calculated  ASCVD 10 yr risk score 6.4%  Plan: 1. Continue current meds - Fish Oil 2400 BID - not meet criteria for statin at this time 2. Continue ASA 81mg  for primary ASCVD risk reduction 3. Encourage improved lifestyle - low carb/cholesterol, reduce portion size, continue improving regular exercise      Morbid obesity with BMI of 50.0-59.9, adult ([place])    Improving weight loss Secondary HTN, OSA      OSA treated with BiPAP    Well controlled, chronic OSA on BiPAP, now >15 years, had initial PSG 2006 - Good adherence to BiPAP nightly - Continue current BiPAP therapy, patient seems to be benefiting from therapy - he has error message on machine if not functioning in future may reorder test - In future if needs update will consider referral to Pulmonology given morbid obesity likely factor and on BiPAP      Vitamin D deficiency    Mild low Vit D No significant clinical concern, may be linked to obesity Recommend OTC Vit D3 1,000 to 2,000 daily supplement       Other Visit Diagnoses    Annual physical exam    -  Primary   Needs flu shot       Relevant Orders   Flu Vaccine QUAD 36+ mos IM (Completed)   Screening for colon cancer       Relevant Orders   Cologuard      Updated Health Maintenance information  Due for routine colon cancer screening. Never had colonoscopy. - Discussion today about recommendations for either Colonoscopy or Cologuard  screening, benefits and risks of screening, interested in Cologuard, understands that if positive then recommendation is for diagnostic colonoscopy to follow-up. - Ordered Cologuard today  Flu shot today  Reviewed recent lab results with patient Encouraged improvement to lifestyle with diet and exercise - Goal of weight loss  Completed biometric form for work with lab results, signed - returned to patient today.  No orders of the defined types were placed in this encounter.    Follow up plan: Return in about 1 year (around 09/01/2020) for Annual Physical.   Future labs to be ordered next year when notified for [employer] clinic.  Nobie Putnam, [facility] 09/02/2019, 9:16 AM

## 2019-09-02 NOTE — Assessment & Plan Note (Signed)
Improving now nearly at normal level with wt loss and normalized cholesterol

## 2019-09-02 NOTE — Assessment & Plan Note (Signed)
Mild low Vit D No significant clinical concern, may be linked to obesity Recommend OTC Vit D3 1,000 to 2,000 daily supplement

## 2019-09-02 NOTE — Patient Instructions (Addendum)
Thank you for coming to the office today.  Flu shot today  No more pneumonia vaccine until age 54.  Mildly Low Vitamin D  Start OTC Vitamin D3 1,000 to 2,000 iu daily for maintenance supplement - if helping can continue  Call [facility] [place] / Dr Lacinda Axon to re-schedule PT for piriformis syndrome  Colon Cancer Screening: - For all adults age 41+ routine colon cancer screening is highly recommended.     - Recent guidelines from [place] recommend starting age of 32 - Early detection of colon cancer is important, because often there are no warning signs or symptoms, also if found early usually it can be cured. Late stage is hard to treat.  - If you are not interested in Colonoscopy screening (if done and normal you could be cleared for 5 to 10 years until next due), then Cologuard is an excellent alternative for screening test for Colon Cancer. It is highly sensitive for detecting DNA of colon cancer from even the earliest stages. Also, there is NO bowel prep required. - If Cologuard is NEGATIVE, then it is good for 3 years before next due - If Cologuard is POSITIVE, then it is strongly advised to get a Colonoscopy, which allows the GI doctor to locate the source of the cancer or polyp (even very early stage) and treat it by removing it. ------------------------- If you would like to proceed with Cologuard (stool DNA test)  Follow instructions to collect sample, you may call the company for any help or questions, 24/7 telephone support at [phone].  DUE for FASTING BLOOD WORK (no food or drink after midnight before the lab appointment, only water or coffee without cream/sugar on the morning of)  [facility] - at employee clinic next year  - Make sure Lab Only appointment is at about 1 week before your next appointment, so that results will be available  For Lab Results, once available within 2-3 days of blood draw, you can can log in to MyChart online to  view your results and a brief explanation. Also, we can discuss results at next follow-up visit.     Please schedule a Follow-up Appointment to: Return in about 1 year (around 09/01/2020) for Annual Physical.  If you have any other questions or concerns, please feel free to call the office or send a message through [place]. You may also schedule an earlier appointment if necessary.  Additionally, you may be receiving a survey about your experience at our office within a few days to 1 week by e-mail or mail. We value your feedback.  Nobie Putnam, DO [place]

## 2019-09-02 NOTE — Assessment & Plan Note (Signed)
Improved lipids, LDL Last lipid 08/2019 Calculated ASCVD 10 yr risk score 6.4%  Plan: 1. Continue current meds - Fish Oil 2400 BID - not meet criteria for statin at this time 2. Continue ASA 81mg  for primary ASCVD risk reduction 3. Encourage improved lifestyle - low carb/cholesterol, reduce portion size, continue improving regular exercise

## 2019-09-02 NOTE — Assessment & Plan Note (Signed)
Well controlled, chronic OSA on BiPAP, now >15 years, had initial PSG 2006 - Good adherence to BiPAP nightly - Continue current BiPAP therapy, patient seems to be benefiting from therapy - he has error message on machine if not functioning in future may reorder test - In future if needs update will consider referral to Pulmonology given morbid obesity likely factor and on BiPAP

## 2019-09-02 NOTE — Assessment & Plan Note (Signed)
Controlled HTN No known complications - Failed Lisinopril-HCTZ - side effects     Plan:  1. Continue current BP regimen - Metoprolol XL 50mg daily, Telmisartan 80mg daily 2. Encourage improved lifestyle - low sodium diet, improve regular exercise 3. Continue to monitor BP outside office, bring readings to next visit, if persistently >140/90 or new symptoms notify office sooner 

## 2019-10-20 ENCOUNTER — Telehealth: Payer: Self-pay | Admitting: Family Medicine

## 2019-10-20 DIAGNOSIS — J3089 Other allergic rhinitis: Secondary | ICD-10-CM

## 2019-10-20 MED ORDER — FLUTICASONE PROPIONATE 50 MCG/ACT NA SUSP: 2.0000 | Freq: Every day | NASAL | 3 refills | Status: DC

## 2019-10-20 NOTE — Telephone Encounter (Signed)
Pt.called requesting refill on nasal spray

## 2019-10-22 LAB — COLOGUARD: Cologuard: NEGATIVE

## 2019-10-25 ENCOUNTER — Encounter: Payer: Self-pay | Admitting: Family Medicine

## 2019-11-01 ENCOUNTER — Other Ambulatory Visit: Payer: Self-pay | Admitting: Family Medicine

## 2019-11-01 DIAGNOSIS — I10 Essential (primary) hypertension: Secondary | ICD-10-CM

## 2020-03-15 ENCOUNTER — Other Ambulatory Visit: Payer: Self-pay

## 2020-03-15 MED ORDER — DOXYCYCLINE HYCLATE 20 MG PO TABS: 40.0000 mg | ORAL_TABLET | Freq: Every day | ORAL | 0 refills | Status: AC

## 2020-05-10 ENCOUNTER — Ambulatory Visit: Payer: Managed Care, Other (non HMO) | Admitting: Dermatology

## 2020-05-10 ENCOUNTER — Other Ambulatory Visit: Payer: Self-pay

## 2020-05-10 ENCOUNTER — Encounter: Payer: Self-pay | Admitting: Dermatology

## 2020-05-10 ENCOUNTER — Other Ambulatory Visit: Payer: Self-pay | Admitting: Dermatology

## 2020-05-10 DIAGNOSIS — L817 Pigmented purpuric dermatosis: Secondary | ICD-10-CM | POA: Diagnosis not present

## 2020-05-10 DIAGNOSIS — Z1283 Encounter for screening for malignant neoplasm of skin: Secondary | ICD-10-CM

## 2020-05-10 DIAGNOSIS — L905 Scar conditions and fibrosis of skin: Secondary | ICD-10-CM

## 2020-05-10 DIAGNOSIS — L739 Follicular disorder, unspecified: Secondary | ICD-10-CM

## 2020-05-10 DIAGNOSIS — L578 Other skin changes due to chronic exposure to nonionizing radiation: Secondary | ICD-10-CM

## 2020-05-10 DIAGNOSIS — L821 Other seborrheic keratosis: Secondary | ICD-10-CM

## 2020-05-10 DIAGNOSIS — L719 Rosacea, unspecified: Secondary | ICD-10-CM

## 2020-05-10 DIAGNOSIS — D229 Melanocytic nevi, unspecified: Secondary | ICD-10-CM

## 2020-05-10 DIAGNOSIS — D1800 Hemangioma unspecified site: Secondary | ICD-10-CM

## 2020-05-10 DIAGNOSIS — L814 Other melanin hyperpigmentation: Secondary | ICD-10-CM

## 2020-05-10 MED ORDER — CLINDAMYCIN PHOSPHATE 1 % EX SOLN: CUTANEOUS | 6 refills | Status: DC

## 2020-05-10 MED ORDER — DOXYCYCLINE HYCLATE 20 MG PO TABS: 20.0000 mg | ORAL_TABLET | Freq: Two times a day (BID) | ORAL | 11 refills | Status: DC

## 2020-05-10 MED ORDER — KETOCONAZOLE 2 % EX SHAM: MEDICATED_SHAMPOO | CUTANEOUS | 10 refills | Status: DC

## 2020-05-10 NOTE — Patient Instructions (Addendum)
Recommend daily broad spectrum sunscreen SPF 30+ to sun-exposed areas, reapply every 2 hours as needed. Call for new or changing lesions.  Doxycycline should be taken with food to prevent nausea. Do not lay down for 30 minutes after taking. Be cautious with sun exposure and use good sun protection while on this medication. Pregnant women should not take this medication.   

## 2020-05-10 NOTE — Progress Notes (Signed)
Follow-Up Visit   Subjective  Scott Sanford is a 54 y.o. male who presents for the following: TBSE (no real concerns today.). Patient does not have a skin cancer history Patient presents for total-body skin exam for skin cancer screening and mole check.  The following portions of the chart were reviewed this encounter and updated as appropriate:  Tobacco  Allergies  Meds  Problems  Med Hx  Surg Hx  Fam Hx      Review of Systems:  No other skin or systemic complaints except as noted in HPI or Assessment and Plan.  Objective  Well appearing patient in no apparent distress; mood and affect are within normal limits.  A full examination was performed including scalp, head, eyes, ears, nose, lips, neck, chest, axillae, abdomen, back, buttocks, bilateral upper extremities, bilateral lower extremities, hands, feet, fingers, toes, fingernails, and toenails. All findings within normal limits unless otherwise noted below.  Objective  Scalp: Few crusted areas  Objective  face: Pinkness on face today  Objective  Right Lower Leg - Anterior: Dyspigmented smooth macule or patch.   Objective  Left Lower Leg - Anterior, Right Lower Leg - Anterior: Violaceous macules and patches.    Assessment & Plan    Actinic Damage - diffuse scaly erythematous macules with underlying dyspigmentation - Recommend daily broad spectrum sunscreen SPF 30+ to sun-exposed areas, reapply every 2 hours as needed.  - Call for new or changing lesions.  Folliculitis Scalp  Scott Sanford Continue taking Doxycyline 20mg  BID with food Start Ketoconazole shampoo 2% apply to scalp two to three times weekly and leave in 7 to 10 mins.  Start Clindamycin 1% solution apply to scalp where crusted areas are.  Doxycycline should be taken with food to prevent nausea. Do not lay down for 30 minutes after taking. Be cautious with sun exposure and use good sun protection while on this medication. Pregnant women should not  take this medication.    ketoconazole (NIZORAL) 2 % shampoo - Scalp  clindamycin (CLEOCIN T) 1 % external solution - Scalp  Reordered Medications doxycycline (PERIOSTAT) 20 MG tablet  Rosacea face  Continue taking Doxycyline 20mg  BID with food  Doxycycline should be taken with food to prevent nausea. Do not lay down for 30 minutes after taking. Be cautious with sun exposure and use good sun protection while on this medication. Pregnant women should not take this medication.    Reordered Medications doxycycline (PERIOSTAT) 20 MG tablet  Scar Right Lower Leg - Anterior  Scar due to trauma in 1983 Benign, observe.    Schamberg's purpura (2) Left Lower Leg - Anterior; Right Lower Leg - Anterior  Benign, observe.     Lentigines - Scattered tan macules - Discussed due to sun exposure - Benign, observe - Call for any changes  Seborrheic Keratoses - Stuck-on, waxy, tan-brown papules and plaques  - Discussed benign etiology and prognosis. - Observe - Call for any changes  Melanocytic Nevi - Tan-brown and/or pink-flesh-colored symmetric macules and papules - Benign appearing on exam today - Observation - Call clinic for new or changing moles - Recommend daily use of broad spectrum spf 30+ sunscreen to sun-exposed areas.   Hemangiomas - Red papules - Discussed benign nature - Observe - Call for any changes  Actinic Damage - diffuse scaly erythematous macules with underlying dyspigmentation - Recommend daily broad spectrum sunscreen SPF 30+ to sun-exposed areas, reapply every 2 hours as needed.  - Call for new or changing lesions.  Skin  cancer screening performed today.   Return in about 1 year (around 05/10/2021) for TBSE.  I, Donzetta Kohut, CMA, am acting as scribe for [name], MD . Documentation: I have reviewed the above documentation for accuracy and completeness, and I agree with the above.  [name], MD

## 2020-05-13 ENCOUNTER — Encounter: Payer: Self-pay | Admitting: Dermatology

## 2020-06-02 ENCOUNTER — Other Ambulatory Visit: Payer: Self-pay | Admitting: Family Medicine

## 2020-06-02 DIAGNOSIS — I10 Essential (primary) hypertension: Secondary | ICD-10-CM

## 2020-06-02 NOTE — Telephone Encounter (Signed)
Requested Prescriptions  Pending Prescriptions Disp Refills   telmisartan (MICARDIS) 80 MG tablet [Pharmacy Med Name: TELMISARTAN 80 MG TABLET] 30 tablet 1    Sig: TAKE 1 TABLET BY MOUTH EVERYDAY AT BEDTIME     Cardiovascular:  Angiotensin Receptor Blockers Failed - 06/02/2020  2:14 PM      Failed - Cr in normal range and within 180 days    Creat  Date Value Ref Range Status  09/30/2018 0.88 0.70 - 1.33 mg/dL Final    Comment:    For patients >32 years of age, the reference limit for Creatinine is approximately 13% higher for people identified as African-American. .    Creatinine, Ser  Date Value Ref Range Status  08/30/2019 0.97 0.76 - 1.27 mg/dL Final         Failed - K in normal range and within 180 days    Potassium  Date Value Ref Range Status  08/30/2019 4.3 3.5 - 5.2 mmol/L Final         Failed - Valid encounter within last 6 months    Recent Outpatient Visits          9 months ago Annual physical exam   [place], DO   1 year ago Chills without fever   [facility] Merrily[facility] Puma, Jerrel Ivory, NP   1 year ago Essential hypertension   [place], Devonne Doughty, DO   2 years ago Morbid obesity with BMI of 50.0-59.9, adult [facility])   [place], DO             Passed - Patient is not pregnant      Passed - Last BP in normal range    BP Readings from Last 1 Encounters:  09/02/19 139/70

## 2020-06-30 ENCOUNTER — Other Ambulatory Visit: Payer: Self-pay | Admitting: Family Medicine

## 2020-06-30 DIAGNOSIS — I10 Essential (primary) hypertension: Secondary | ICD-10-CM

## 2020-06-30 NOTE — Telephone Encounter (Signed)
Refilled with enough med to last until upcoming appt

## 2020-07-04 ENCOUNTER — Other Ambulatory Visit: Payer: Self-pay | Admitting: Family Medicine

## 2020-07-04 DIAGNOSIS — K219 Gastro-esophageal reflux disease without esophagitis: Secondary | ICD-10-CM

## 2020-07-04 NOTE — Telephone Encounter (Signed)
Requested Prescriptions  Pending Prescriptions Disp Refills  . omeprazole (PRILOSEC) 20 MG capsule [Pharmacy Med Name: OMEPRAZOLE DR 20 MG CAPSULE] 90 capsule 0    Sig: TAKE 1 CAPSULE BY MOUTH EVERY DAY     Gastroenterology: Proton Pump Inhibitors Passed - 07/04/2020  1:22 AM      Passed - Valid encounter within last 12 months    Recent Outpatient Visits          10 months ago Annual physical exam   [place], DO   1 year ago Chills without fever   [facility] Merrilyn Puma, Jerrel Ivory, NP   1 year ago Essential hypertension   [place], DO   2 years ago Morbid obesity with BMI of 50.0-59.9, adult [facility])   [facility] Olin Hauser, DO      Future Appointments            In 2 months [place], Devonne Doughty, DO [facility], [facility]

## 2020-08-18 ENCOUNTER — Other Ambulatory Visit: Payer: Self-pay | Admitting: Family Medicine

## 2020-08-18 DIAGNOSIS — I10 Essential (primary) hypertension: Secondary | ICD-10-CM

## 2020-08-18 DIAGNOSIS — M1A09X0 Idiopathic chronic gout, multiple sites, without tophus (tophi): Secondary | ICD-10-CM

## 2020-08-18 NOTE — Telephone Encounter (Signed)
Requested Prescriptions  Pending Prescriptions Disp Refills   metoprolol succinate (TOPROL-XL) 50 MG 24 hr tablet [Pharmacy Med Name: METOPROLOL SUCC ER 50 MG TAB] 17 tablet 0    Sig: TAKE 1 TABLET (50 MG TOTAL) BY MOUTH DAILY. TAKE WITH OR IMMEDIATELY FOLLOWING A MEAL.     Cardiovascular:  Beta Blockers Failed - 08/18/2020  8:51 AM      Failed - Valid encounter within last 6 months    Recent Outpatient Visits          11 months ago Annual physical exam   [place], DO   1 year ago Chills without fever   [facility] Merrilyn Puma, Jerrel Ivory, NP   2 years ago Essential hypertension   [place], Devonne Doughty, DO   2 years ago Morbid obesity with BMI of 50.0-59.9, adult ([place])   [place], DO      Future Appointments            In 2 weeks [place], Devonne Doughty, DO [facility], [place] BP in normal range    BP Readings from Last 1 Encounters:  09/02/19 139/70         Passed - Last Heart Rate in normal range    Pulse Readings from Last 1 Encounters:  09/02/19 (!) 53          allopurinol (ZYLOPRIM) 300 MG tablet [Pharmacy Med Name: ALLOPURINOL 300 MG TABLET] 90 tablet 0    Sig: TAKE 1 TABLET BY [place]     Endocrinology:  Gout Agents Failed - 08/18/2020  8:51 AM      Failed - Uric Acid in normal range and within 360 days    Uric Acid, Serum  Date Value Ref Range Status  08/13/2018 5.2 4.0 - 8.0 mg/dL Final    Comment:    Therapeutic target for gout patients: <6.0 mg/dL .    Uric Acid  Date Value Ref Range Status  07/21/2017 4.9 3.7 - 8.6 mg/dL Final    Comment:               Therapeutic target for gout patients: <6.0         Passed - Cr in normal range and within 360 days    Creat  Date Value Ref Range Status  09/30/2018 0.88 0.70 - 1.33 mg/dL Final    Comment:    For patients >49 years  of age, the reference limit for Creatinine is approximately 13% higher for people identified as African-American. .    Creatinine, Ser  Date Value Ref Range Status  08/30/2019 0.97 0.76 - 1.27 mg/dL Final         Passed - Valid encounter within last 12 months    Recent Outpatient Visits          11 months ago Annual physical exam   [place], DO   1 year ago Chills without fever   [facility] Merrilyn Puma, Jerrel Ivory, NP   2 years ago Essential hypertension   [place], DO   2 years ago Morbid obesity with BMI of 50.0-59.9, adult [facility])   [facility] Olin Hauser, DO      Future Appointments  In 2 weeks [place], Devonne Doughty, DO [facility], [facility]            '

## 2020-08-28 ENCOUNTER — Other Ambulatory Visit: Payer: Self-pay

## 2020-08-28 ENCOUNTER — Telehealth: Payer: Self-pay | Admitting: Family Medicine

## 2020-08-28 ENCOUNTER — Other Ambulatory Visit: Payer: Managed Care, Other (non HMO)

## 2020-08-28 DIAGNOSIS — E782 Mixed hyperlipidemia: Secondary | ICD-10-CM

## 2020-08-28 DIAGNOSIS — E559 Vitamin D deficiency, unspecified: Secondary | ICD-10-CM

## 2020-08-28 DIAGNOSIS — Z125 Encounter for screening for malignant neoplasm of prostate: Secondary | ICD-10-CM

## 2020-08-28 DIAGNOSIS — Z0000 Encounter for general adult medical examination without abnormal findings: Secondary | ICD-10-CM

## 2020-08-28 DIAGNOSIS — I10 Essential (primary) hypertension: Secondary | ICD-10-CM

## 2020-08-28 DIAGNOSIS — R7309 Other abnormal glucose: Secondary | ICD-10-CM

## 2020-08-28 DIAGNOSIS — Z9189 Other specified personal risk factors, not elsewhere classified: Secondary | ICD-10-CM

## 2020-08-28 NOTE — Telephone Encounter (Signed)
Signed orders.  Added COVID antibody semiquant as requested by patient today  Nobie Putnam, DO [facility] 08/28/2020, 10:01 AM

## 2020-08-29 ENCOUNTER — Other Ambulatory Visit: Payer: Self-pay | Admitting: Family Medicine

## 2020-08-29 ENCOUNTER — Other Ambulatory Visit: Payer: Self-pay

## 2020-08-29 DIAGNOSIS — L739 Follicular disorder, unspecified: Secondary | ICD-10-CM

## 2020-08-29 DIAGNOSIS — I10 Essential (primary) hypertension: Secondary | ICD-10-CM

## 2020-08-29 DIAGNOSIS — L719 Rosacea, unspecified: Secondary | ICD-10-CM

## 2020-08-29 MED ORDER — DOXYCYCLINE HYCLATE 20 MG PO TABS: 20.0000 mg | ORAL_TABLET | Freq: Two times a day (BID) | ORAL | 2 refills | Status: DC

## 2020-08-29 NOTE — Telephone Encounter (Signed)
Requested medication (s) are due for refill today: Yes  Requested medication (s) are on the active medication list: Yes  Last refill:  06/30/20  Future visit scheduled: Yes  Notes to clinic:  Unable to refill per protocol, courtesy refill already given     Requested Prescriptions  Pending Prescriptions Disp Refills   telmisartan (MICARDIS) 80 MG tablet [Pharmacy Med Name: TELMISARTAN 80 MG TABLET] 65 tablet 0    Sig: TAKE 1 TABLET BY MOUTH EVERYDAY AT BEDTIME      Cardiovascular:  Angiotensin Receptor Blockers Failed - 08/29/2020 11:32 AM      Failed - Valid encounter within last 6 months    Recent Outpatient Visits           12 months ago Annual physical exam   [facility] Olin Hauser, DO   1 year ago Chills without fever   [facility] Merrilyn Puma, Jerrel Ivory, NP   2 years ago Essential hypertension   [place], Devonne Doughty, DO   2 years ago Morbid obesity with BMI of 50.0-59.9, adult [facility])   [place], DO       Future Appointments             In 5 days [place] Ranger, Devonne Doughty, DO [facility], [place] in normal range and within 180 days    Creat  Date Value Ref Range Status  08/28/2020 0.99 0.70 - 1.33 mg/dL Final    Comment:    For patients >55 years of age, the reference limit for Creatinine is approximately 13% higher for people identified as African-American. .           Passed - K in normal range and within 180 days    Potassium  Date Value Ref Range Status  08/28/2020 3.9 3.5 - 5.3 mmol/L Final          Passed - Patient is not pregnant      Passed - Last BP in normal range    BP Readings from Last 1 Encounters:  09/02/19 139/70

## 2020-08-29 NOTE — Progress Notes (Signed)
Pharmacy sent fax requesting 90 day supply

## 2020-09-01 LAB — TSH: TSH: 1.44 mIU/L (ref 0.40–4.50)

## 2020-09-01 LAB — CBC WITH DIFFERENTIAL/PLATELET
Absolute Monocytes: 386 cells/uL (ref 200–950)
Basophils Absolute: 20 cells/uL (ref 0–200)
Basophils Relative: 0.6 %
Eosinophils Absolute: 50 cells/uL (ref 15–500)
Eosinophils Relative: 1.5 %
HCT: 45.2 % (ref 38.5–50.0)
Hemoglobin: 15.2 g/dL (ref 13.2–17.1)
Lymphs Abs: 1244 cells/uL (ref 850–3900)
MCH: 31.9 pg (ref 27.0–33.0)
MCHC: 33.6 g/dL (ref 32.0–36.0)
MCV: 94.8 fL (ref 80.0–100.0)
MPV: 9.8 fL (ref 7.5–12.5)
Monocytes Relative: 11.7 %
Neutro Abs: 1601 cells/uL (ref 1500–7800)
Neutrophils Relative %: 48.5 %
Platelets: 137 10*3/uL — ABNORMAL LOW (ref 140–400)
RBC: 4.77 10*6/uL (ref 4.20–5.80)
RDW: 13.2 % (ref 11.0–15.0)
Total Lymphocyte: 37.7 %
WBC: 3.3 10*3/uL — ABNORMAL LOW (ref 3.8–10.8)

## 2020-09-01 LAB — COMPLETE METABOLIC PANEL WITH GFR
AG Ratio: 2 (calc) (ref 1.0–2.5)
ALT: 50 U/L — ABNORMAL HIGH (ref 9–46)
AST: 36 U/L — ABNORMAL HIGH (ref 10–35)
Albumin: 4.3 g/dL (ref 3.6–5.1)
Alkaline phosphatase (APISO): 87 U/L (ref 35–144)
BUN: 19 mg/dL (ref 7–25)
CO2: 22 mmol/L (ref 20–32)
Calcium: 9.6 mg/dL (ref 8.6–10.3)
Chloride: 106 mmol/L (ref 98–110)
Creat: 0.99 mg/dL (ref 0.70–1.33)
GFR, Est African American: 99 mL/min/{1.73_m2} (ref >=60)
GFR, Est Non African American: 85 mL/min/{1.73_m2} (ref >=60)
Globulin: 2.1 g/dL (calc) (ref 1.9–3.7)
Glucose, Bld: 87 mg/dL (ref 65–99)
Potassium: 3.9 mmol/L (ref 3.5–5.3)
Sodium: 140 mmol/L (ref 135–146)
Total Bilirubin: 0.9 mg/dL (ref 0.2–1.2)
Total Protein: 6.4 g/dL (ref 6.1–8.1)

## 2020-09-01 LAB — LIPID PANEL
Cholesterol: 160 mg/dL (ref <200)
HDL: 40 mg/dL (ref >=40)
LDL Cholesterol (Calc): 104 mg/dL (calc) — ABNORMAL HIGH
Non-HDL Cholesterol (Calc): 120 mg/dL (calc) (ref <130)
Total CHOL/HDL Ratio: 4 (calc) (ref <5.0)
Triglycerides: 73 mg/dL (ref <150)

## 2020-09-01 LAB — PSA: PSA: 0.5 ng/mL (ref <=4.0)

## 2020-09-01 LAB — HEMOGLOBIN A1C
Hgb A1c MFr Bld: 5.6 % of total Hgb (ref <5.7)
Mean Plasma Glucose: 114 (calc)
eAG (mmol/L): 6.3 (calc)

## 2020-09-01 LAB — VITAMIN D 25 HYDROXY (VIT D DEFICIENCY, FRACTURES): Vit D, 25-Hydroxy: 40 ng/mL (ref 30–100)

## 2020-09-01 LAB — SARS-COV-2 SEMI-QUANTITATIVE TOTAL ANTIBODY, SPIKE: SARS COV2 AB, Total Spike Semi QN: >2500 U/mL — ABNORMAL HIGH (ref <0.8)

## 2020-09-03 ENCOUNTER — Encounter: Payer: Self-pay | Admitting: Family Medicine

## 2020-09-03 ENCOUNTER — Other Ambulatory Visit: Payer: Self-pay

## 2020-09-03 ENCOUNTER — Ambulatory Visit (INDEPENDENT_AMBULATORY_CARE_PROVIDER_SITE_OTHER): Payer: Managed Care, Other (non HMO) | Admitting: Family Medicine

## 2020-09-03 VITALS — BP 116/57 | HR 56 | Temp 96.6°F | Resp 16 | Ht 70.0 in | Wt 375.0 lb

## 2020-09-03 DIAGNOSIS — R7309 Other abnormal glucose: Secondary | ICD-10-CM

## 2020-09-03 DIAGNOSIS — Z23 Encounter for immunization: Secondary | ICD-10-CM

## 2020-09-03 DIAGNOSIS — I10 Essential (primary) hypertension: Secondary | ICD-10-CM

## 2020-09-03 DIAGNOSIS — Z6841 Body Mass Index (BMI) 40.0 and over, adult: Secondary | ICD-10-CM

## 2020-09-03 DIAGNOSIS — Z0000 Encounter for general adult medical examination without abnormal findings: Secondary | ICD-10-CM | POA: Diagnosis not present

## 2020-09-03 DIAGNOSIS — G4733 Obstructive sleep apnea (adult) (pediatric): Secondary | ICD-10-CM

## 2020-09-03 DIAGNOSIS — M5430 Sciatica, unspecified side: Secondary | ICD-10-CM

## 2020-09-03 NOTE — Assessment & Plan Note (Signed)
Well controlled, chronic OSA on BiPAP, now >15 years, had initial PSG 2006 - Good adherence to BiPAP nightly - Continue current BiPAP therapy, patient seems to be benefiting from therapy - In future if needs update will consider referral to Pulmonology given morbid obesity likely factor and on BiPAP 

## 2020-09-03 NOTE — Assessment & Plan Note (Signed)
Right lower back/hip radiating into leg, has reduced ROM Prior piriformis diagnosis Will handwrite order for [facility] next for patient to schedule when ready

## 2020-09-03 NOTE — Assessment & Plan Note (Signed)
Stable, A1c 5.6, below range of PreDM Concern with obesity, HTN, HLD  Plan:  1. Not on any therapy currently  2. Encourage improved lifestyle - low carb, low sugar diet, reduce portion size, continue improving exercise

## 2020-09-03 NOTE — Progress Notes (Signed)
Subjective:    Patient ID: Scott Sanford, male    DOB: 08/10/1966, 54 y.o.   MRN: [id number]  Scott Sanford is a 54 y.o. male presenting on 09/03/2020 for Annual Exam   HPI   Here for Annual Physical and Lab Review.  CHRONIC HTN: No new concerns Current Meds -Metoprolol XL 50mg  daily, Telmisartan 80mg  daily Reports good compliance, took meds today. Tolerating well, w/o complaints.  OSA, onBiPAP -Previously seen by Pulmonology had PSG in 2006, then new machine in 2010, has been on BiPAP for long time now, doing well on it - Today hereports that sleep apnea is well controlled.Heuses the BiPAP machine every night. Tolerates the machine well, and thinks that sleeps better with it and feels good. No new concerns or symptoms.  HYPERLIPIDEMIA  - Reports no concerns. Last lipid panel9/2021 improved lipids, total cholesterol and TG down - Currently takingFish Oil 2400 BID, tolerating well without side effects or myalgias - Never on Statin - Taking ASA 81mg  daily  Vitamin D Last lab normal range, currently taking Vitamin D3 2,000 unit daily supplement.  Morbid Obesity BMI >53 Interval improved weight loss. Diet: Reducing portion size Exercise: less active, limited walking due to Right leg inner groin, from prior back injury, he has persistent issue with ambulation causing him to limp. He did not do PT due to COVID  Dumping Syndrome / S/p Cholecystectomy Reports since gallbladder removed since 2006 Describes frequent runny bowel movements, worse in AM He tried metamucil fiber, limited benefit.  PMH - History of Gout/ Uric Acid Nephrolithiasis (Left) - on allopurinol, no recent flare, possible kidney stone recently since passed stone he believes as it resolved. Rarely takes a hydrocodone from prior stone episode.  Prior history back pain, followed by [facility] 01/2019 last, was going to do PT for piriformis syndrome, but cancelled d/t covid he has not returned  yet He is now interested in PT option  Health Maintenance:  UTD Shingrix x 2 doses in 2018  Due Flu vaccine, will get today  Colon CA Screening: Cologuard completed 10/22/19 - Negative, next due in 09/2022  Prostate CA Screening:Prior PSA / DRE reported normal, Last PSA0.9(08/2019). Currently asymptomatic.No known family history of prostate CA.  Prior history of Pneumonia vaccine in 2006 after significant episode of pneumonia, asking about repeat.   Depression screen [facility] 2/9 09/03/2020 09/02/2019 08/13/2018  Decreased Interest 0 0 0  Down, Depressed, Hopeless 0 0 0  PHQ - 2 Score 0 0 0  Tired, decreased energy - - -  Change in appetite - - -  Feeling bad or failure about yourself  - - -  Trouble concentrating - - -  Moving slowly or fidgety/restless - - -  Suicidal thoughts - - -  Difficult doing work/chores - - -    Past Medical History:  Diagnosis Date  . 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 Sleep apnea    Past Surgical History:  Procedure Laterality Date  . CHOLECYSTECTOMY  2006   Gallstones  . TONSILLECTOMY  1984   Social History   Socioeconomic History  . Marital status: Divorced    Spouse name: Not on file  . Number of children: Not on file  . Years of education: College  . Highest education level: Bachelor's degree (e.g., BA, AB, BS)  Occupational History  . Occupation: [employer] Dept  Tobacco Use  . Smoking status: Never Smoker  .  Smokeless tobacco: Never Used  Vaping Use  . Vaping Use: Never used  Substance and Sexual Activity  . Alcohol use: Yes    Alcohol/week: 1.0 standard drink    Types: 1 Standard drinks or equivalent per week    Comment: occasionally  . Drug use: No  . Sexual activity: Not on file  Other Topics Concern  . Not on file  Social History Narrative  . Not on file   Social Determinants of Health   Financial Resource Strain:   . Difficulty of Paying  Living Expenses: Not on file  Food Insecurity:   . Worried About Charity fundraiser in the Last Year: Not on file  . Ran Out of Food in the Last Year: Not on file  Transportation Needs:   . Lack of Transportation (Medical): Not on file  . Lack of Transportation (Non-Medical): Not on file  Physical Activity:   . Days of Exercise per Week: Not on file  . Minutes of Exercise per Session: Not on file  Stress:   . Feeling of Stress : Not on file  Social Connections:   . Frequency of Communication with Friends and Family: Not on file  . Frequency of Social Gatherings with Friends and Family: Not on file  . Attends Religious Services: Not on file  . Active Member of Clubs or Organizations: Not on file  . Attends Archivist Meetings: Not on file  . Marital Status: Not on file  Intimate Partner Violence:   . Fear of Current or Ex-Partner: Not on file  . Emotionally Abused: Not on file  . Physically Abused: Not on file  . Sexually Abused: Not on file   Family History  Problem Relation Age of Onset  . Lung cancer Mother        2015, apex of lung surgical removal  . Diabetes Father   . Dementia Paternal Grandmother   . Stroke Paternal Grandfather   . Prostate cancer Neg Hx   . Colon cancer Neg Hx    Current Outpatient Medications on File Prior to Visit  Medication Sig  . allopurinol (ZYLOPRIM) 300 MG tablet TAKE 1 TABLET BY MOUTH EVERY DAY  . aspirin 81 MG chewable tablet Chew by mouth daily.  . Cholecalciferol (VITAMIN D) 50 MCG (2000 UT) CAPS Take 2,000 Units by mouth daily.  . clindamycin (CLEOCIN T) 1 % external solution Used topically to scalp on any crusted area once daily prn  . cyclobenzaprine (FLEXERIL) 10 MG tablet TAKE 1 TABLET BY MOUTH THREE TIMES A DAY AS NEEDED FOR MUSCLE SPASMS  . doxycycline (PERIOSTAT) 20 MG tablet Take 1 tablet (20 mg total) by mouth 2 (two) times daily.  . fluticasone (FLONASE) 50 MCG/ACT nasal spray Place 2 sprays into both nostrils  daily. Use for 4-6 weeks then stop and use seasonally or as needed.  . Ginkgo Biloba 40 MG TABS Take by mouth.  Marland Kitchen ketoconazole (NIZORAL) 2 % shampoo Start Ketoconazole shampoo 2% apply to scalp two to three times weekly and leave in 7 to 10 mins.  . metoprolol succinate (TOPROL-XL) 50 MG 24 hr tablet TAKE 1 TABLET (50 MG TOTAL) BY MOUTH DAILY. TAKE WITH OR IMMEDIATELY FOLLOWING A MEAL.  . Multiple Vitamin (MULTIVITAMIN) capsule Take 1 capsule by mouth daily.  . Omega-3 Fatty Acids (FISH OIL PO) Take 2,400 mg by mouth 2 (two) times daily.  Marland Kitchen omeprazole (PRILOSEC) 20 MG capsule TAKE 1 CAPSULE BY MOUTH EVERY DAY  . Probiotic Product (  PROBIOTIC-10 PO) Take by mouth.  . telmisartan (MICARDIS) 80 MG tablet TAKE 1 TABLET BY MOUTH EVERYDAY AT BEDTIME   No current facility-administered medications on file prior to visit.    Review of Systems  Constitutional: Negative for activity change, appetite change, chills, diaphoresis, fatigue and fever.  HENT: Negative for congestion and hearing loss.   Eyes: Negative for visual disturbance.  Respiratory: Negative for apnea, cough, chest tightness, shortness of breath and wheezing.   Cardiovascular: Negative for chest pain, palpitations and leg swelling.  Gastrointestinal: Negative for abdominal pain, anal bleeding, blood in stool, constipation, diarrhea, nausea and vomiting.  Endocrine: Negative for cold intolerance.  Genitourinary: Negative for difficulty urinating, dysuria, frequency and hematuria.  Musculoskeletal: Negative for arthralgias, back pain and neck pain.  Skin: Negative for rash.  Allergic/Immunologic: Negative for environmental allergies.  Neurological: Negative for dizziness, weakness, light-headedness, numbness and headaches.  Hematological: Negative for adenopathy.  Psychiatric/Behavioral: Negative for behavioral problems, dysphoric mood and sleep disturbance. The patient is not nervous/anxious.    Per HPI unless specifically indicated  above      Objective:    BP (!) 116/57   Pulse (!) 56   Temp (!) 96.6 F (35.9 C) (Temporal)   Resp 16   Ht 5\' 10"  (1.778 m)   Wt (!) 375 lb (170.1 kg)   SpO2 99%   BMI 53.81 kg/m   Wt Readings from Last 3 Encounters:  09/03/20 (!) 375 lb (170.1 kg)  09/02/19 (!) 379 lb (171.9 kg)  09/30/18 (!) 394 lb (178.7 kg)    Physical Exam Vitals and nursing note reviewed.  Constitutional:      General: He is not in acute distress.    Appearance: He is well-developed. He is obese. He is not diaphoretic.     Comments: Well-appearing, comfortable, cooperative  HENT:     Head: Normocephalic and atraumatic.  Eyes:     General:        Right eye: No discharge.        Left eye: No discharge.     Conjunctiva/sclera: Conjunctivae normal.     Pupils: Pupils are equal, round, and reactive to light.  Neck:     Thyroid: No thyromegaly.     Vascular: No carotid bruit.  Cardiovascular:     Rate and Rhythm: Normal rate and regular rhythm.     Heart sounds: Normal heart sounds. No murmur heard.   Pulmonary:     Effort: Pulmonary effort is normal. No respiratory distress.     Breath sounds: Normal breath sounds. No wheezing or rales.  Abdominal:     General: Bowel sounds are normal. There is no distension.     Palpations: Abdomen is soft. There is no mass.     Tenderness: There is no abdominal tenderness.  Musculoskeletal:        General: No tenderness. Normal range of motion.     Cervical back: Normal range of motion and neck supple.     Right lower leg: No edema.     Left lower leg: No edema.     Comments: Upper / Lower Extremities: - Normal muscle tone, strength bilateral upper extremities 5/5, lower extremities 5/5  Lymphadenopathy:     Cervical: No cervical adenopathy.  Skin:    General: Skin is warm and dry.     Findings: No erythema or rash.  Neurological:     Mental Status: He is alert and oriented to person, place, and time.     Comments:  Distal sensation intact to light  touch all extremities  Psychiatric:        Behavior: Behavior normal.     Comments: Well groomed, good eye contact, normal speech and thoughts       Results for orders placed or performed in visit on 08/28/20  COMPLETE METABOLIC PANEL WITH GFR  Result Value Ref Range   Glucose, Bld 87 65 - 99 mg/dL   BUN 19 7 - 25 mg/dL   Creat 0.99 0.70 - 1.33 mg/dL   GFR, Est Non African American 85 > OR = 60 mL/min/1.64m2   GFR, Est African American 99 > OR = 60 mL/min/1.56m2   BUN/Creatinine Ratio NOT APPLICABLE 6 - 22 (calc)   Sodium 140 135 - 146 mmol/L   Potassium 3.9 3.5 - 5.3 mmol/L   Chloride 106 98 - 110 mmol/L   CO2 22 20 - 32 mmol/L   Calcium 9.6 8.6 - 10.3 mg/dL   Total Protein 6.4 6.1 - 8.1 g/dL   Albumin 4.3 3.6 - 5.1 g/dL   Globulin 2.1 1.9 - 3.7 g/dL (calc)   AG Ratio 2.0 1.0 - 2.5 (calc)   Total Bilirubin 0.9 0.2 - 1.2 mg/dL   Alkaline phosphatase (APISO) 87 35 - 144 U/L   AST 36 (H) 10 - 35 U/L   ALT 50 (H) 9 - 46 U/L  Hemoglobin A1c  Result Value Ref Range   Hgb A1c MFr Bld 5.6 <5.7 % of total Hgb   Mean Plasma Glucose 114 (calc)   eAG (mmol/L) 6.3 (calc)  CBC with Differential/Platelet  Result Value Ref Range   WBC 3.3 (L) 3.8 - 10.8 Thousand/uL   RBC 4.77 4.20 - 5.80 Million/uL   Hemoglobin 15.2 13.2 - 17.1 g/dL   HCT 45.2 38 - 50 %   MCV 94.8 80.0 - 100.0 fL   MCH 31.9 27.0 - 33.0 pg   MCHC 33.6 32.0 - 36.0 g/dL   RDW 13.2 11.0 - 15.0 %   Platelets 137 (L) 140 - 400 Thousand/uL   MPV 9.8 7.5 - 12.5 fL   Neutro Abs 1,601 1,500 - 7,800 cells/uL   Lymphs Abs 1,244 850 - 3,900 cells/uL   Absolute Monocytes 386 200 - 950 cells/uL   Eosinophils Absolute 50 15 - 500 cells/uL   Basophils Absolute 20 0 - 200 cells/uL   Neutrophils Relative % 48.5 %   Total Lymphocyte 37.7 %   Monocytes Relative 11.7 %   Eosinophils Relative 1.5 %   Basophils Relative 0.6 %  PSA  Result Value Ref Range   PSA 0.5 < OR = 4.0 ng/mL  Lipid panel  Result Value Ref Range    Cholesterol 160 <200 mg/dL   HDL 40 > OR = 40 mg/dL   Triglycerides 73 <150 mg/dL   LDL Cholesterol (Calc) 104 (H) mg/dL (calc)   Total CHOL/HDL Ratio 4.0 <5.0 (calc)   Non-HDL Cholesterol (Calc) 120 <130 mg/dL (calc)  SARS-CoV-2 Semi-Quantitative Total Antibody, Spike  Result Value Ref Range   SARS COV2 AB, Total Spike Semi QN >2,500.0 (H) <0.8 U/mL  TSH  Result Value Ref Range   TSH 1.44 0.40 - 4.50 mIU/L  VITAMIN D 25 Hydroxy (Vit-D Deficiency, Fractures)  Result Value Ref Range   Vit D, 25-Hydroxy 40 30 - 100 ng/mL      Assessment & Plan:   Problem List Items Addressed This Visit    Sciatic leg pain    Right lower back/hip radiating into leg, has  reduced ROM Prior piriformis diagnosis Will handwrite order for [facility] next for patient to schedule when ready      OSA treated with BiPAP    Well controlled, chronic OSA on BiPAP, now >15 years, had initial PSG 2006 - Good adherence to BiPAP nightly - Continue current BiPAP therapy, patient seems to be benefiting from therapy - In future if needs update will consider referral to Pulmonology given morbid obesity likely factor and on BiPAP      Morbid obesity with BMI of 50.0-59.9, adult (HCC)    Gradual improved wt loss Lifestyle regimen      Essential hypertension    Controlled HTN No known complications - Failed Lisinopril-HCTZ - side effects     Plan:  1. Continue current BP regimen - Metoprolol XL 50mg  daily, Telmisartan 80mg  daily 2. Encourage improved lifestyle - low sodium diet, improve regular exercise 3. Continue to monitor BP outside office, bring readings to next visit, if persistently >140/90 or new symptoms notify office sooner      Elevated hemoglobin A1c    Stable, A1c 5.6, below range of PreDM Concern with obesity, HTN, HLD  Plan:  1. Not on any therapy currently  2. Encourage improved lifestyle - low carb, low sugar diet, reduce portion size, continue improving exercise       Other Visit  Diagnoses    Annual physical exam    -  Primary   Needs flu shot       Relevant Orders   Flu Vaccine QUAD 36+ mos IM (Completed)      Updated Health Maintenance information - Future Hep C antibody, next year - Due Flu vaccine today - Cologuard updated, next due 09/2022 Reviewed recent lab results with patient Encouraged improvement to lifestyle with diet and exercise - Goal of weight loss  #S/p Cholecystectomy Has diarrhea / urgency daily in AM often Will look into Questran vs Colestipol med options for him and f/u when he is interested to trial something    No orders of the defined types were placed in this encounter.    Follow up plan: Return in about 6 months (around 03/03/2021) for 6 month Weight Loss / Bile Diarrhea.   Completed biometric form for annual physical   Nobie Putnam, [facility] 09/03/2020, 9:17 AM

## 2020-09-03 NOTE — Assessment & Plan Note (Signed)
Controlled HTN No known complications - Failed Lisinopril-HCTZ - side effects     Plan:  1. Continue current BP regimen - Metoprolol XL 50mg daily, Telmisartan 80mg daily 2. Encourage improved lifestyle - low sodium diet, improve regular exercise 3. Continue to monitor BP outside office, bring readings to next visit, if persistently >140/90 or new symptoms notify office sooner 

## 2020-09-03 NOTE — Assessment & Plan Note (Signed)
Gradual improved wt loss Lifestyle regimen

## 2020-09-03 NOTE — Patient Instructions (Addendum)
Thank you for coming to the office today.  For Dumping syndrome Try OTC Peppermint Oil (Triple Coated Capsule) 180mg  take one 3 times daily to reduce diarrhea  We can consider the Questran vs Colestid medicine to see if this helps the diarrhea in future.  Call insurance find cost and coverage of the following  Saxenda - DAILY injection Wegovy - WEEKLY injection   Rybelsus (pill - oral semaglutide or ozempic) - once daily, taken first thing in morning without other meds Ozempic (injection once week Trulicity (injection once week  We can get these newer meds at low cost if you are interested.  3 benefits - 1 significantly reduced A1c sugar, and may be able to reduce or stop metformin in future - 2 reduced appetite and weight loss with good results - 3 cardiovascular risk reduction, less likely to have heart attack/stroke    Please schedule a Follow-up Appointment to: Return in about 6 months (around 03/03/2021) for 6 month Weight Loss / Bile Diarrhea.  If you have any other questions or concerns, please feel free to call the office or send a message through [facility]. You may also schedule an earlier appointment if necessary.  Additionally, you may be receiving a survey about your experience at our office within a few days to 1 week by e-mail or mail. We value your feedback.  Nobie Putnam, DO [place]

## 2020-09-20 ENCOUNTER — Other Ambulatory Visit: Payer: Self-pay | Admitting: Family Medicine

## 2020-09-20 DIAGNOSIS — K219 Gastro-esophageal reflux disease without esophagitis: Secondary | ICD-10-CM

## 2020-09-20 DIAGNOSIS — I10 Essential (primary) hypertension: Secondary | ICD-10-CM

## 2020-09-24 ENCOUNTER — Telehealth: Payer: Self-pay

## 2020-09-24 ENCOUNTER — Other Ambulatory Visit: Payer: Self-pay | Admitting: Family Medicine

## 2020-09-24 DIAGNOSIS — K219 Gastro-esophageal reflux disease without esophagitis: Secondary | ICD-10-CM

## 2020-09-24 DIAGNOSIS — I10 Essential (primary) hypertension: Secondary | ICD-10-CM

## 2020-09-24 MED ORDER — METOPROLOL SUCCINATE ER 50 MG PO TB24: 50.0000 mg | ORAL_TABLET | Freq: Every day | ORAL | 0 refills | Status: DC

## 2020-09-24 MED ORDER — OMEPRAZOLE 20 MG PO CPDR: DELAYED_RELEASE_CAPSULE | ORAL | 0 refills | Status: DC

## 2020-09-24 NOTE — Addendum Note (Signed)
Addended by: Carlisle Beers on: 09/24/2020 02:28 PM   Modules accepted: Orders

## 2020-09-24 NOTE — Telephone Encounter (Signed)
Resending to to pharmacy

## 2020-09-24 NOTE — Telephone Encounter (Signed)
As per [facility] the following 09/21/2020 prescription was not received, please resend metoprolol succinate (TOPROL-XL) 50 MG 24 hr tablet and omeprazole (PRILOSEC) 20 MG capsule. Patient would like a follow up call when completed.   [id number] - [place], [place] - [address] Phone:  [phone]  Fax:  [phone]

## 2020-10-01 NOTE — Telephone Encounter (Signed)
Open in error

## 2020-10-12 DIAGNOSIS — R21 Rash and other nonspecific skin eruption: Secondary | ICD-10-CM

## 2020-10-12 MED ORDER — PREDNISONE 10 MG PO TABS: ORAL_TABLET | ORAL | 0 refills | Status: DC

## 2020-11-05 ENCOUNTER — Other Ambulatory Visit: Payer: Self-pay | Admitting: Family Medicine

## 2020-11-05 DIAGNOSIS — K219 Gastro-esophageal reflux disease without esophagitis: Secondary | ICD-10-CM

## 2020-12-03 ENCOUNTER — Other Ambulatory Visit: Payer: Self-pay | Admitting: Family Medicine

## 2020-12-03 DIAGNOSIS — M1A09X0 Idiopathic chronic gout, multiple sites, without tophus (tophi): Secondary | ICD-10-CM

## 2021-03-04 ENCOUNTER — Encounter: Payer: Self-pay | Admitting: Family Medicine

## 2021-03-04 ENCOUNTER — Other Ambulatory Visit: Payer: Self-pay

## 2021-03-04 ENCOUNTER — Other Ambulatory Visit: Payer: Self-pay | Admitting: Family Medicine

## 2021-03-04 ENCOUNTER — Ambulatory Visit (INDEPENDENT_AMBULATORY_CARE_PROVIDER_SITE_OTHER): Payer: Managed Care, Other (non HMO) | Admitting: Family Medicine

## 2021-03-04 VITALS — BP 117/72 | HR 67 | Ht 71.0 in | Wt 374.8 lb

## 2021-03-04 DIAGNOSIS — I10 Essential (primary) hypertension: Secondary | ICD-10-CM

## 2021-03-04 DIAGNOSIS — Z6841 Body Mass Index (BMI) 40.0 and over, adult: Secondary | ICD-10-CM

## 2021-03-04 DIAGNOSIS — R7309 Other abnormal glucose: Secondary | ICD-10-CM

## 2021-03-04 DIAGNOSIS — G4733 Obstructive sleep apnea (adult) (pediatric): Secondary | ICD-10-CM

## 2021-03-04 DIAGNOSIS — Z125 Encounter for screening for malignant neoplasm of prostate: Secondary | ICD-10-CM

## 2021-03-04 DIAGNOSIS — N200 Calculus of kidney: Secondary | ICD-10-CM

## 2021-03-04 DIAGNOSIS — Z1159 Encounter for screening for other viral diseases: Secondary | ICD-10-CM

## 2021-03-04 DIAGNOSIS — Z0000 Encounter for general adult medical examination without abnormal findings: Secondary | ICD-10-CM

## 2021-03-04 DIAGNOSIS — E782 Mixed hyperlipidemia: Secondary | ICD-10-CM

## 2021-03-04 MED ORDER — KETOROLAC TROMETHAMINE 10 MG PO TABS: 10.0000 mg | ORAL_TABLET | Freq: Four times a day (QID) | ORAL | 1 refills | Status: DC | PRN

## 2021-03-04 MED ORDER — TAMSULOSIN HCL 0.4 MG PO CAPS: 0.4000 mg | ORAL_CAPSULE | Freq: Every day | ORAL | 1 refills | Status: DC | PRN

## 2021-03-04 NOTE — Assessment & Plan Note (Signed)
Controlled HTN No known complications - Failed Lisinopril-HCTZ - side effects     Plan:  1. Continue current BP regimen - Metoprolol XL 50mg daily, Telmisartan 80mg daily 2. Encourage improved lifestyle - low sodium diet, improve regular exercise 3. Continue to monitor BP outside office, bring readings to next visit, if persistently >140/90 or new symptoms notify office sooner 

## 2021-03-04 NOTE — Progress Notes (Signed)
Subjective:    Patient ID: Scott Sanford, male    DOB: 1965/12/31, 55 y.o.   MRN: [id number]  Scott Sanford is a 55 y.o. male presenting on 03/04/2021 for Weight Loss and Diarrhea ( )   HPI   Cerumen Impaction s/p resolved  Wears a molded ear plug device for hunting, felt like build up and pressure in ears, and he said large piece of ear wax came out with peroxide  Weight Loss / Diarrhea Morbid Obesity Persistent diarrhea, said since 2006, not tried imodium PRN yet. Recent illness caused weight loss Has tried Golo supplement now  Nephrolithiasis, Left side Prior kidney stone back in 10/2020 had significant flare up, took 4 days to pass. He had old rx for Tamsulosin and Toradol 10mg  PO PRN with good results, now nearly out of meds, these were from 2018 originally and he request new refills for PRN use in future.   OSA, onBiPAP -Previously seen by Pulmonology had PSG in 2006, then new machine in 2010, has been on BiPAP for long time now, doing well on it - Today hereports that sleep apnea is well controlled.Heuses the BiPAP machine every night. Tolerates the machine well, and thinks that sleeps better with it and feels good. No new concerns or symptoms. He has error on screen about motor outdated, asks about new sleep study.  CHRONIC HTN: No new concerns Current Meds -Metoprolol XL 50mg  daily, Telmisartan 80mg  daily Reports good compliance, took meds today. Tolerating well, w/o complaints.   Depression screen [facility] 2/9 09/03/2020 09/02/2019 08/13/2018  Decreased Interest 0 0 0  Down, Depressed, Hopeless 0 0 0  PHQ - 2 Score 0 0 0  Tired, decreased energy - - -  Change in appetite - - -  Feeling bad or failure about yourself  - - -  Trouble concentrating - - -  Moving slowly or fidgety/restless - - -  Suicidal thoughts - - -  Difficult doing work/chores - - -    Social History   Tobacco Use  . Smoking status: Never Smoker  . Smokeless tobacco: Never Used  Vaping Use   . Vaping Use: Never used  Substance Use Topics  . Alcohol use: Yes    Alcohol/week: 1.0 standard drink    Types: 1 Standard drinks or equivalent per week    Comment: occasionally  . Drug use: No    Review of Systems Per HPI unless specifically indicated above     Objective:    BP 117/72   Pulse 67   Ht 5\' 11"  (1.803 m)   Wt (!) 374 lb 12.8 oz (170 kg)   SpO2 98%   BMI 52.27 kg/m   Wt Readings from Last 3 Encounters:  03/04/21 (!) 374 lb 12.8 oz (170 kg)  09/03/20 (!) 375 lb (170.1 kg)  09/02/19 (!) 379 lb (171.9 kg)    Physical Exam Vitals and nursing note reviewed.  Constitutional:      General: He is not in acute distress.    Appearance: He is well-developed. He is not diaphoretic.     Comments: Well-appearing, comfortable, cooperative  HENT:     Head: Normocephalic and atraumatic.  Eyes:     General:        Right eye: No discharge.        Left eye: No discharge.     Conjunctiva/sclera: Conjunctivae normal.  Cardiovascular:     Rate and Rhythm: Normal rate.  Pulmonary:     Effort: Pulmonary  effort is normal.  Skin:    General: Skin is warm and dry.     Findings: No erythema or rash.  Neurological:     Mental Status: He is alert and oriented to person, place, and time.  Psychiatric:        Behavior: Behavior normal.     Comments: Well groomed, good eye contact, normal speech and thoughts    Results for orders placed or performed in visit on 08/28/20  COMPLETE METABOLIC PANEL WITH GFR  Result Value Ref Range   Glucose, Bld 87 65 - 99 mg/dL   BUN 19 7 - 25 mg/dL   Creat 0.99 0.70 - 1.33 mg/dL   GFR, Est Non African American 85 > OR = 60 mL/min/1.36m2   GFR, Est African American 99 > OR = 60 mL/min/1.26m2   BUN/Creatinine Ratio NOT APPLICABLE 6 - 22 (calc)   Sodium 140 135 - 146 mmol/L   Potassium 3.9 3.5 - 5.3 mmol/L   Chloride 106 98 - 110 mmol/L   CO2 22 20 - 32 mmol/L   Calcium 9.6 8.6 - 10.3 mg/dL   Total Protein 6.4 6.1 - 8.1 g/dL   Albumin  4.3 3.6 - 5.1 g/dL   Globulin 2.1 1.9 - 3.7 g/dL (calc)   AG Ratio 2.0 1.0 - 2.5 (calc)   Total Bilirubin 0.9 0.2 - 1.2 mg/dL   Alkaline phosphatase (APISO) 87 35 - 144 U/L   AST 36 (H) 10 - 35 U/L   ALT 50 (H) 9 - 46 U/L  Hemoglobin A1c  Result Value Ref Range   Hgb A1c MFr Bld 5.6 <5.7 % of total Hgb   Mean Plasma Glucose 114 (calc)   eAG (mmol/L) 6.3 (calc)  CBC with Differential/Platelet  Result Value Ref Range   WBC 3.3 (L) 3.8 - 10.8 Thousand/uL   RBC 4.77 4.20 - 5.80 Million/uL   Hemoglobin 15.2 13.2 - 17.1 g/dL   HCT 45.2 38.5 - 50.0 %   MCV 94.8 80.0 - 100.0 fL   MCH 31.9 27.0 - 33.0 pg   MCHC 33.6 32.0 - 36.0 g/dL   RDW 13.2 11.0 - 15.0 %   Platelets 137 (L) 140 - 400 Thousand/uL   MPV 9.8 7.5 - 12.5 fL   Neutro Abs 1,601 1,500 - 7,800 cells/uL   Lymphs Abs 1,244 850 - 3,900 cells/uL   Absolute Monocytes 386 200 - 950 cells/uL   Eosinophils Absolute 50 15 - 500 cells/uL   Basophils Absolute 20 0 - 200 cells/uL   Neutrophils Relative % 48.5 %   Total Lymphocyte 37.7 %   Monocytes Relative 11.7 %   Eosinophils Relative 1.5 %   Basophils Relative 0.6 %  PSA  Result Value Ref Range   PSA 0.5 < OR = 4.0 ng/mL  Lipid panel  Result Value Ref Range   Cholesterol 160 <200 mg/dL   HDL 40 > OR = 40 mg/dL   Triglycerides 73 <150 mg/dL   LDL Cholesterol (Calc) 104 (H) mg/dL (calc)   Total CHOL/HDL Ratio 4.0 <5.0 (calc)   Non-HDL Cholesterol (Calc) 120 <130 mg/dL (calc)  SARS-CoV-2 Semi-Quantitative Total Antibody, Spike  Result Value Ref Range   SARS COV2 AB, Total Spike Semi QN >2,500.0 (H) <0.8 U/mL  TSH  Result Value Ref Range   TSH 1.44 0.40 - 4.50 mIU/L  VITAMIN D 25 Hydroxy (Vit-D Deficiency, Fractures)  Result Value Ref Range   Vit D, 25-Hydroxy 40 30 - 100 ng/mL  Assessment & Plan:   Problem List Items Addressed This Visit    OSA treated with BiPAP    Well controlled, chronic OSA on BiPAP, now >15 years, had initial PSG 2006 - Good adherence to  BiPAP nightly - Continue current BiPAP therapy, patient seems to be benefiting from therapy - In future if needs update will consider referral to Pulmonology given morbid obesity likely factor and on BiPAP  His BIPAP machine has an error issue, and may pursue new PSG - gave him [facility] site      Morbid obesity with BMI of 50.0-59.9, adult ([facility])   Essential hypertension    Controlled HTN No known complications - Failed Lisinopril-HCTZ - side effects     Plan:  1. Continue current BP regimen - Metoprolol XL 50mg  daily, Telmisartan 80mg  daily 2. Encourage improved lifestyle - low sodium diet, improve regular exercise 3. Continue to monitor BP outside office, bring readings to next visit, if persistently >140/90 or new symptoms notify office sooner       Other Visit Diagnoses    Left nephrolithiasis    -  Primary   Relevant Medications   ketorolac (TORADOL) 10 MG tablet   tamsulosin (FLOMAX) 0.4 MG CAPS capsule      No active kidney stone today Will re order meds for future PRN flare. He has rarely used except emergency with stone.  Meds ordered this encounter  Medications  . ketorolac (TORADOL) 10 MG tablet    Sig: Take 1 tablet (10 mg total) by mouth every 6 (six) hours as needed for moderate pain (kidney stone).    Dispense:  20 tablet    Refill:  1  . tamsulosin (FLOMAX) 0.4 MG CAPS capsule    Sig: Take 1 capsule (0.4 mg total) by mouth daily as needed (kidney stone).    Dispense:  30 capsule    Refill:  1     Follow up plan:  Return in about 6 months (around 09/04/2021) for 6 month fasting lab only then 1 week later Annual Physical.  Future labs ordered for  September 2022 add Hep C antibody  Nobie Putnam, DO [facility] 03/04/2021, 8:32 AM

## 2021-03-04 NOTE — Addendum Note (Signed)
Addended by: Olin Hauser on: 03/04/2021 12:52 PM   Modules accepted: Level of Service

## 2021-03-04 NOTE — Patient Instructions (Addendum)
Thank you for coming to the office today.  [email or web address]  Check into new Sleep Study / BiPAP  -------------  Try OTC Imodium for slowing down diarrhea, use it as needed. Short term only, it can cause constipation, goal to use when traveling or days you cannot stop to handle diarrhea.  ------------  BP is controlled.  Ears look great overall, small residual spot of wax on L side but not blocking.  DUE for FASTING BLOOD WORK (no food or drink after midnight before the lab appointment, only water or coffee without cream/sugar on the morning of)  SCHEDULE "Lab Only" visit in the morning at the clinic for lab draw in 6 MONTHS   - Make sure Lab Only appointment is at about 1 week before your next appointment, so that results will be available  For Lab Results, once available within 2-3 days of blood draw, you can can log in to MyChart online to view your results and a brief explanation. Also, we can discuss results at next follow-up visit.    Please schedule a Follow-up Appointment to: Return in about 6 months (around 09/04/2021) for 6 month fasting lab only then 1 week later Annual Physical.  If you have any other questions or concerns, please feel free to call the office or send a message through [place]. You may also schedule an earlier appointment if necessary.  Additionally, you may be receiving a survey about your experience at our office within a few days to 1 week by e-mail or mail. We value your feedback.  Nobie Putnam, DO [place]

## 2021-03-04 NOTE — Assessment & Plan Note (Signed)
Well controlled, chronic OSA on BiPAP, now >15 years, had initial PSG 2006 - Good adherence to BiPAP nightly - Continue current BiPAP therapy, patient seems to be benefiting from therapy - In future if needs update will consider referral to Pulmonology given morbid obesity likely factor and on BiPAP  His BIPAP machine has an error issue, and may pursue new PSG - gave him sleepdocdirect site

## 2021-03-14 ENCOUNTER — Other Ambulatory Visit: Payer: Self-pay | Admitting: Family Medicine

## 2021-03-14 DIAGNOSIS — I10 Essential (primary) hypertension: Secondary | ICD-10-CM

## 2021-03-14 NOTE — Telephone Encounter (Signed)
Requested Prescriptions  Pending Prescriptions Disp Refills  . metoprolol succinate (TOPROL-XL) 50 MG 24 hr tablet [Pharmacy Med Name: METOPROLOL SUCC ER 50 MG TAB] 90 tablet 0    Sig: TAKE 1 TABLET (50 MG TOTAL) BY MOUTH DAILY. TAKE WITH OR IMMEDIATELY FOLLOWING A MEAL.     Cardiovascular:  Beta Blockers Passed - 03/14/2021 12:04 AM      Passed - Last BP in normal range    BP Readings from Last 1 Encounters:  03/04/21 117/72         Passed - Last Heart Rate in normal range    Pulse Readings from Last 1 Encounters:  03/04/21 67         Passed - Valid encounter within last 6 months    Recent Outpatient Visits          1 week ago Left nephrolithiasis   [place], DO   6 months ago Annual physical exam   [facility] Olin Hauser, DO   1 year ago Annual physical exam   [facility] Olin Hauser, DO   2 years ago Chills without fever   [facility] Merrilyn Puma, Jerrel Ivory, NP   2 years ago Essential hypertension   [place], DO      Future Appointments            In 5 months [place] Ranger, Devonne Doughty, [facility], [facility]

## 2021-03-29 ENCOUNTER — Other Ambulatory Visit: Payer: Self-pay | Admitting: Family Medicine

## 2021-03-29 DIAGNOSIS — N200 Calculus of kidney: Secondary | ICD-10-CM

## 2021-05-10 ENCOUNTER — Other Ambulatory Visit: Payer: Self-pay | Admitting: Family Medicine

## 2021-05-10 DIAGNOSIS — K219 Gastro-esophageal reflux disease without esophagitis: Secondary | ICD-10-CM

## 2021-05-10 NOTE — Telephone Encounter (Signed)
Requested Prescriptions  Pending Prescriptions Disp Refills  . omeprazole (PRILOSEC) 20 MG capsule [Pharmacy Med Name: OMEPRAZOLE DR 20 MG CAPSULE] 90 capsule 1    Sig: TAKE 1 CAPSULE BY MOUTH EVERY DAY     Gastroenterology: Proton Pump Inhibitors Passed - 05/10/2021  1:26 AM      Passed - Valid encounter within last 12 months    Recent Outpatient Visits          2 months ago Left nephrolithiasis   [place], DO   8 months ago Annual physical exam   [facility] Olin Hauser, DO   1 year ago Annual physical exam   [facility] Olin Hauser, DO   2 years ago Chills without fever   [facility] Merrilyn Puma, Jerrel Ivory, NP   2 years ago Essential hypertension   [place], DO      Future Appointments            In 3 months Parks Ranger, Devonne Doughty, DO [facility], [facility]

## 2021-05-15 ENCOUNTER — Ambulatory Visit: Payer: Managed Care, Other (non HMO) | Admitting: Dermatology

## 2021-05-15 ENCOUNTER — Other Ambulatory Visit: Payer: Self-pay

## 2021-05-15 DIAGNOSIS — L820 Inflamed seborrheic keratosis: Secondary | ICD-10-CM

## 2021-05-15 DIAGNOSIS — L814 Other melanin hyperpigmentation: Secondary | ICD-10-CM | POA: Diagnosis not present

## 2021-05-15 DIAGNOSIS — D485 Neoplasm of uncertain behavior of skin: Secondary | ICD-10-CM | POA: Diagnosis not present

## 2021-05-15 DIAGNOSIS — L578 Other skin changes due to chronic exposure to nonionizing radiation: Secondary | ICD-10-CM | POA: Diagnosis not present

## 2021-05-15 DIAGNOSIS — L821 Other seborrheic keratosis: Secondary | ICD-10-CM | POA: Diagnosis not present

## 2021-05-15 DIAGNOSIS — D492 Neoplasm of unspecified behavior of bone, soft tissue, and skin: Secondary | ICD-10-CM

## 2021-05-15 DIAGNOSIS — Z1283 Encounter for screening for malignant neoplasm of skin: Secondary | ICD-10-CM

## 2021-05-15 DIAGNOSIS — D1800 Hemangioma unspecified site: Secondary | ICD-10-CM

## 2021-05-15 DIAGNOSIS — D229 Melanocytic nevi, unspecified: Secondary | ICD-10-CM

## 2021-05-15 NOTE — Patient Instructions (Signed)

## 2021-05-15 NOTE — Progress Notes (Signed)
Follow-Up Visit   Subjective  Scott Sanford is a 55 y.o. male who presents for the following: Annual Exam (Check spots on the back ). Pt c/o irritated spot in the groin area will not go away. The patient presents for Total-Body Skin Exam (TBSE) for skin cancer screening and mole check.  The following portions of the chart were reviewed this encounter and updated as appropriate:   Tobacco  Allergies  Meds  Problems  Med Hx  Surg Hx  Fam Hx     Review of Systems:  No other skin or systemic complaints except as noted in HPI or Assessment and Plan.  Objective  Well appearing patient in no apparent distress; mood and affect are within normal limits.  A focused examination was performed including face,arms,chest,back,legs . Relevant physical exam findings are noted in the Assessment and Plan.  Objective  Left posterior shoulder: Erythematous keratotic or waxy stuck-on papule or plaque.   Objective  right groin: 0.6 cm irritated tag   Assessment & Plan  Inflamed seborrheic keratosis Left posterior shoulder  Destruction of lesion - Left posterior shoulder Complexity: simple   Destruction method: cryotherapy   Informed consent: discussed and consent obtained   Timeout:  patient name, date of birth, surgical site, and procedure verified Lesion destroyed using liquid nitrogen: Yes   Region frozen until ice ball extended beyond lesion: Yes   Outcome: patient tolerated procedure well with no complications   Post-procedure details: wound care instructions given    Neoplasm of skin right groin  Epidermal / dermal shaving  Lesion diameter (cm):  0.6 Informed consent: discussed and consent obtained   Patient was prepped and draped in usual sterile fashion: area prepped with alcohol. Anesthesia: the lesion was anesthetized in a standard fashion   Anesthetic:  1% lidocaine w/ epinephrine 1-100,000 buffered w/ 8.4% NaHCO3 Instrument used: flexible razor blade   Hemostasis  achieved with: pressure, aluminum chloride and electrodesiccation   Outcome: patient tolerated procedure well   Post-procedure details: wound care instructions given   Post-procedure details comment:  Ointment and small bandage applied  Specimen 1 - Surgical pathology Differential Diagnosis: R/O ISK vs Skin tag vs other  Check Margins: No 0.6 cm irritated tag  Skin cancer screening   Lentigines - Scattered tan macules - Due to sun exposure - Benign-appering, observe - Recommend daily broad spectrum sunscreen SPF 30+ to sun-exposed areas, reapply every 2 hours as needed. - Call for any changes  Seborrheic Keratoses - Stuck-on, waxy, tan-brown papules and/or plaques  - Benign-appearing - Discussed benign etiology and prognosis. - Observe - Call for any changes  Melanocytic Nevi - Tan-brown and/or pink-flesh-colored symmetric macules and papules - Benign appearing on exam today - Observation - Call clinic for new or changing moles - Recommend daily use of broad spectrum spf 30+ sunscreen to sun-exposed areas.   Hemangiomas - Red papules - Discussed benign nature - Observe - Call for any changes  Actinic Damage - Chronic condition, secondary to cumulative UV/sun exposure - diffuse scaly erythematous macules with underlying dyspigmentation - Recommend daily broad spectrum sunscreen SPF 30+ to sun-exposed areas, reapply every 2 hours as needed.  - Staying in the shade or wearing long sleeves, sun glasses (UVA+UVB protection) and wide brim hats (4-inch brim around the entire circumference of the hat) are also recommended for sun protection.  - Call for new or changing lesions.  Skin cancer screening performed today.  Return in about 6 months (around 11/15/2021) for TBSE.  IMarye Round, CMA, am acting as scribe for [name], MD .  Documentation: I have reviewed the above documentation for accuracy and completeness, and I agree with the above.  [name],  MD

## 2021-05-20 ENCOUNTER — Encounter: Payer: Self-pay | Admitting: Dermatology

## 2021-05-23 ENCOUNTER — Encounter: Payer: Self-pay | Admitting: Dermatology

## 2021-05-27 ENCOUNTER — Telehealth: Payer: Self-pay

## 2021-05-27 NOTE — Telephone Encounter (Signed)
-----   Message from Ralene Bathe, MD sent at 05/24/2021 12:39 PM EDT ----- Diagnosis Skin , right groin [place]  Benign skin tag No further treatment needed

## 2021-05-27 NOTE — Telephone Encounter (Signed)
Left message on voicemail to return my call.  

## 2021-05-27 NOTE — Telephone Encounter (Signed)
Pt called LM on VM viewed his biopsy results, benign, no treatment needed, he is aware

## 2021-06-06 ENCOUNTER — Other Ambulatory Visit: Payer: Self-pay | Admitting: Family Medicine

## 2021-06-06 ENCOUNTER — Other Ambulatory Visit: Payer: Self-pay | Admitting: Dermatology

## 2021-06-06 DIAGNOSIS — M1A09X0 Idiopathic chronic gout, multiple sites, without tophus (tophi): Secondary | ICD-10-CM

## 2021-06-06 DIAGNOSIS — L719 Rosacea, unspecified: Secondary | ICD-10-CM

## 2021-06-06 DIAGNOSIS — L739 Follicular disorder, unspecified: Secondary | ICD-10-CM

## 2021-06-20 ENCOUNTER — Ambulatory Visit
Admission: RE | Admit: 2021-06-20 | Discharge: 2021-06-20 | Disposition: A | Discharge status: Home / Self Care | Payer: Managed Care, Other (non HMO) | Source: Ambulatory Visit | Attending: Physician Assistant | Admitting: Physician Assistant

## 2021-06-20 ENCOUNTER — Other Ambulatory Visit: Payer: Self-pay

## 2021-06-20 VITALS — BP 146/77 | HR 63 | Temp 98.3°F | Resp 16

## 2021-06-20 DIAGNOSIS — I10 Essential (primary) hypertension: Secondary | ICD-10-CM | POA: Diagnosis not present

## 2021-06-20 DIAGNOSIS — J3489 Other specified disorders of nose and nasal sinuses: Secondary | ICD-10-CM

## 2021-06-20 DIAGNOSIS — R591 Generalized enlarged lymph nodes: Secondary | ICD-10-CM

## 2021-06-20 MED ORDER — DOXYCYCLINE HYCLATE 100 MG PO CAPS: 100.0000 mg | ORAL_CAPSULE | Freq: Two times a day (BID) | ORAL | 0 refills | Status: AC

## 2021-06-20 MED ORDER — MUPIROCIN CALCIUM 2 % NA OINT: 1.0000 "application " | TOPICAL_OINTMENT | Freq: Two times a day (BID) | NASAL | 1 refills | Status: DC

## 2021-06-20 NOTE — Discharge Instructions (Addendum)
There is an infection inside your nose.  I have sent in an antibiotic ointment to apply to the area couple of times a day.  If this is not improving over the next couple of days or if it worsens you should fill and start the oral antibiotic.  The swollen area of your neck is a enlarged lymph node in response to the nasal infection.  This should go down as the infection improves/resolves.  He can take ibuprofen and/or Tylenol for discomfort.  Blood pressure was elevated in the clinic today.  Please keep a log of BPs and follow-up with PCP if consistently greater than 140/90 as you may need medication dose adjustment.
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
## 2021-06-20 NOTE — ED Triage Notes (Signed)
PT feels like right nostril is "flared", it's uncomfortable. He used fluticasone last night and today, discomfort started prior to use.  Reports he can palpate a swollen area underneath right jaw , first noticed this 2 days ago. Tender with palpation.

## 2021-08-30 ENCOUNTER — Other Ambulatory Visit: Payer: Managed Care, Other (non HMO)

## 2021-09-02 ENCOUNTER — Other Ambulatory Visit: Payer: Self-pay | Admitting: Family Medicine

## 2021-09-02 DIAGNOSIS — I10 Essential (primary) hypertension: Secondary | ICD-10-CM

## 2021-09-03 NOTE — Telephone Encounter (Signed)
.  rxp

## 2021-09-05 ENCOUNTER — Other Ambulatory Visit: Payer: Self-pay

## 2021-09-05 DIAGNOSIS — Z1159 Encounter for screening for other viral diseases: Secondary | ICD-10-CM

## 2021-09-05 DIAGNOSIS — E782 Mixed hyperlipidemia: Secondary | ICD-10-CM

## 2021-09-05 DIAGNOSIS — I10 Essential (primary) hypertension: Secondary | ICD-10-CM

## 2021-09-05 DIAGNOSIS — Z0000 Encounter for general adult medical examination without abnormal findings: Secondary | ICD-10-CM

## 2021-09-05 DIAGNOSIS — R7309 Other abnormal glucose: Secondary | ICD-10-CM

## 2021-09-05 DIAGNOSIS — G4733 Obstructive sleep apnea (adult) (pediatric): Secondary | ICD-10-CM

## 2021-09-05 DIAGNOSIS — Z125 Encounter for screening for malignant neoplasm of prostate: Secondary | ICD-10-CM

## 2021-09-06 ENCOUNTER — Other Ambulatory Visit: Payer: Self-pay

## 2021-09-06 ENCOUNTER — Encounter: Payer: Managed Care, Other (non HMO) | Admitting: Family Medicine

## 2021-09-06 ENCOUNTER — Other Ambulatory Visit: Payer: Managed Care, Other (non HMO)

## 2021-09-09 LAB — LIPID PANEL
Cholesterol: 172 mg/dL (ref <200)
HDL: 42 mg/dL (ref >=40)
LDL Cholesterol (Calc): 109 mg/dL (calc) — ABNORMAL HIGH
Non-HDL Cholesterol (Calc): 130 mg/dL (calc) — ABNORMAL HIGH (ref <130)
Total CHOL/HDL Ratio: 4.1 (calc) (ref <5.0)
Triglycerides: 104 mg/dL (ref <150)

## 2021-09-09 LAB — CBC WITH DIFFERENTIAL/PLATELET
Absolute Monocytes: 309 cells/uL (ref 200–950)
Basophils Absolute: 31 cells/uL (ref 0–200)
Basophils Relative: 0.9 %
Eosinophils Absolute: 41 cells/uL (ref 15–500)
Eosinophils Relative: 1.2 %
HCT: 47.2 % (ref 38.5–50.0)
Hemoglobin: 16.2 g/dL (ref 13.2–17.1)
Lymphs Abs: 1295 cells/uL (ref 850–3900)
MCH: 32.4 pg (ref 27.0–33.0)
MCHC: 34.3 g/dL (ref 32.0–36.0)
MCV: 94.4 fL (ref 80.0–100.0)
MPV: 10.2 fL (ref 7.5–12.5)
Monocytes Relative: 9.1 %
Neutro Abs: 1724 cells/uL (ref 1500–7800)
Neutrophils Relative %: 50.7 %
Platelets: 148 10*3/uL (ref 140–400)
RBC: 5 10*6/uL (ref 4.20–5.80)
RDW: 13.3 % (ref 11.0–15.0)
Total Lymphocyte: 38.1 %
WBC: 3.4 10*3/uL — ABNORMAL LOW (ref 3.8–10.8)

## 2021-09-09 LAB — COMPLETE METABOLIC PANEL WITH GFR
AG Ratio: 2.3 (calc) (ref 1.0–2.5)
ALT: 71 U/L — ABNORMAL HIGH (ref 9–46)
AST: 43 U/L — ABNORMAL HIGH (ref 10–35)
Albumin: 4.6 g/dL (ref 3.6–5.1)
Alkaline phosphatase (APISO): 93 U/L (ref 35–144)
BUN: 16 mg/dL (ref 7–25)
CO2: 27 mmol/L (ref 20–32)
Calcium: 9.9 mg/dL (ref 8.6–10.3)
Chloride: 106 mmol/L (ref 98–110)
Creat: 0.98 mg/dL (ref 0.70–1.30)
Globulin: 2 g/dL (calc) (ref 1.9–3.7)
Glucose, Bld: 107 mg/dL — ABNORMAL HIGH (ref 65–99)
Potassium: 4.4 mmol/L (ref 3.5–5.3)
Sodium: 141 mmol/L (ref 135–146)
Total Bilirubin: 1.1 mg/dL (ref 0.2–1.2)
Total Protein: 6.6 g/dL (ref 6.1–8.1)
eGFR: 91 mL/min/{1.73_m2} (ref >=60)

## 2021-09-09 LAB — HEMOGLOBIN A1C
Hgb A1c MFr Bld: 5.7 % of total Hgb — ABNORMAL HIGH (ref <5.7)
Mean Plasma Glucose: 117 mg/dL
eAG (mmol/L): 6.5 mmol/L

## 2021-09-09 LAB — PSA: PSA: 0.48 ng/mL (ref <=4.00)

## 2021-09-09 LAB — HEPATITIS C ANTIBODY
Hepatitis C Ab: NONREACTIVE
SIGNAL TO CUT-OFF: 0.01 (ref <1.00)

## 2021-09-13 ENCOUNTER — Other Ambulatory Visit: Payer: Self-pay

## 2021-09-13 ENCOUNTER — Other Ambulatory Visit: Payer: Self-pay | Admitting: Family Medicine

## 2021-09-13 ENCOUNTER — Ambulatory Visit (INDEPENDENT_AMBULATORY_CARE_PROVIDER_SITE_OTHER): Payer: Managed Care, Other (non HMO) | Admitting: Family Medicine

## 2021-09-13 ENCOUNTER — Encounter: Payer: Self-pay | Admitting: Family Medicine

## 2021-09-13 VITALS — BP 118/53 | HR 56 | Ht 71.0 in | Wt 366.0 lb

## 2021-09-13 DIAGNOSIS — G4733 Obstructive sleep apnea (adult) (pediatric): Secondary | ICD-10-CM

## 2021-09-13 DIAGNOSIS — Z0000 Encounter for general adult medical examination without abnormal findings: Secondary | ICD-10-CM

## 2021-09-13 DIAGNOSIS — I10 Essential (primary) hypertension: Secondary | ICD-10-CM

## 2021-09-13 DIAGNOSIS — M1A09X0 Idiopathic chronic gout, multiple sites, without tophus (tophi): Secondary | ICD-10-CM

## 2021-09-13 DIAGNOSIS — K219 Gastro-esophageal reflux disease without esophagitis: Secondary | ICD-10-CM

## 2021-09-13 DIAGNOSIS — G8929 Other chronic pain: Secondary | ICD-10-CM

## 2021-09-13 DIAGNOSIS — Z23 Encounter for immunization: Secondary | ICD-10-CM | POA: Diagnosis not present

## 2021-09-13 MED ORDER — ALLOPURINOL 300 MG PO TABS: 300.0000 mg | ORAL_TABLET | Freq: Every day | ORAL | 3 refills | Status: DC

## 2021-09-13 MED ORDER — OMEPRAZOLE 20 MG PO CPDR: 20.0000 mg | DELAYED_RELEASE_CAPSULE | Freq: Every day | ORAL | 3 refills | Status: DC

## 2021-09-13 MED ORDER — TELMISARTAN 80 MG PO TABS: 80.0000 mg | ORAL_TABLET | Freq: Every day | ORAL | 3 refills | Status: DC

## 2021-09-13 MED ORDER — CYCLOBENZAPRINE HCL 10 MG PO TABS: 10.0000 mg | ORAL_TABLET | Freq: Three times a day (TID) | ORAL | 1 refills | Status: DC | PRN

## 2021-09-13 MED ORDER — METOPROLOL SUCCINATE ER 50 MG PO TB24: 50.0000 mg | ORAL_TABLET | Freq: Every day | ORAL | 3 refills | Status: DC

## 2021-09-13 NOTE — Patient Instructions (Addendum)
Thank you for coming to the office today.  Age 55 for Pneumonia vaccine when ready.  Flu Shot today.  Completed Form for physical.  We will need to keep an eye on your Liver Enzymes. May be due to cholesterol and weight. We can order an Ultrasound to check the liver status when you are ready - anytime in few weeks or months, message and I can order it to the [facility].   DUE for FASTING BLOOD WORK (no food or drink after midnight before the lab appointment, only water or coffee without cream/sugar on the morning of)  SCHEDULE "Lab Only" visit in the morning at the clinic for lab draw in 1 YEAR  - Make sure Lab Only appointment is at about 1 week before your next appointment, so that results will be available  For Lab Results, once available within 2-3 days of blood draw, you can can log in to MyChart online to view your results and a brief explanation. Also, we can discuss results at next follow-up visit.    Please schedule a Follow-up Appointment to: Return in about 1 year (around 09/13/2022) for 1 year fasting lab only then 1 week later Annual Physical.  If you have any other questions or concerns, please feel free to call the office or send a message through [place]. You may also schedule an earlier appointment if necessary.  Additionally, you may be receiving a survey about your experience at our office within a few days to 1 week by e-mail or mail. We value your feedback.  Nobie Putnam, DO [place]
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
## 2021-09-15 NOTE — Assessment & Plan Note (Signed)
Well controlled, chronic OSA on BiPAP, now >15 years, had initial PSG 2006 - Good adherence to BiPAP nightly - Continue current BiPAP therapy, patient seems to be benefiting from therapy - In future if needs update will consider referral to Pulmonology given morbid obesity likely factor and on BiPAP 

## 2021-11-05 ENCOUNTER — Other Ambulatory Visit: Payer: Self-pay | Admitting: Dermatology

## 2021-11-05 DIAGNOSIS — L739 Follicular disorder, unspecified: Secondary | ICD-10-CM

## 2021-11-06 ENCOUNTER — Ambulatory Visit: Payer: Managed Care, Other (non HMO) | Admitting: Dermatology

## 2022-01-27 ENCOUNTER — Other Ambulatory Visit: Payer: Self-pay | Admitting: Family Medicine

## 2022-01-27 DIAGNOSIS — N200 Calculus of kidney: Secondary | ICD-10-CM

## 2022-01-28 NOTE — Telephone Encounter (Signed)
Requested Prescriptions  Pending Prescriptions Disp Refills   tamsulosin (FLOMAX) 0.4 MG CAPS capsule [Pharmacy Med Name: TAMSULOSIN HCL 0.4 MG CAPSULE] 30 capsule 1    Sig: TAKE 1 CAPSULE (0.4 MG TOTAL) BY MOUTH DAILY AS NEEDED (KIDNEY STONE).     Urology: Alpha-Adrenergic Blocker Passed - 01/27/2022 12:33 PM      Passed - PSA in normal range and within 360 days    PSA  Date Value Ref Range Status  09/06/2021 0.48 < OR = 4.00 ng/mL Final    Comment:    The total PSA value from this assay system is  standardized against the WHO standard. The test  result will be approximately 20% lower when compared  to the equimolar-standardized total PSA (Beckman  Coulter). Comparison of serial PSA results should be  interpreted with this fact in mind. . This test was performed using the Siemens  chemiluminescent method. Values obtained from  different assay methods cannot be used interchangeably. PSA levels, regardless of value, should not be interpreted as absolute evidence of the presence or absence of disease.    Prostate Specific Ag, Serum  Date Value Ref Range Status  08/30/2019 0.9 0.0 - 4.0 ng/mL Final    Comment:    Roche ECLIA methodology. According to the American Urological Association, Serum PSA should decrease and remain at undetectable levels after radical prostatectomy. The AUA defines biochemical recurrence as an initial PSA value 0.2 ng/mL or greater followed by a subsequent confirmatory PSA value 0.2 ng/mL or greater. Values obtained with different assay methods or kits cannot be used interchangeably. Results cannot be interpreted as absolute evidence of the presence or absence of malignant disease.          Passed - Last BP in normal range    BP Readings from Last 1 Encounters:  09/13/21 (!) 118/53         Passed - Valid encounter within last 12 months    Recent Outpatient Visits          4 months ago Annual physical exam   [place], DO   11 months ago Left nephrolithiasis   [place], Devonne Doughty, DO   1 year ago Annual physical exam   [facility] Olin Hauser, DO   2 years ago Annual physical exam   [facility] Olin Hauser, DO   3 years ago Chills without fever   [facility] Mikey College, NP      Future Appointments            In 3 months Ralene Bathe, MD [place]   In 7 months [facility], [facility]

## 2022-01-31 ENCOUNTER — Other Ambulatory Visit: Payer: Self-pay | Admitting: Family Medicine

## 2022-01-31 DIAGNOSIS — N200 Calculus of kidney: Secondary | ICD-10-CM

## 2022-01-31 NOTE — Telephone Encounter (Signed)
Requested Prescriptions  Pending Prescriptions Disp Refills   tamsulosin (FLOMAX) 0.4 MG CAPS capsule [Pharmacy Med Name: TAMSULOSIN HCL 0.4 MG CAPSULE] 90 capsule 0    Sig: TAKE 1 CAPSULE (0.4 MG TOTAL) BY MOUTH DAILY AS NEEDED (KIDNEY STONE).     Urology: Alpha-Adrenergic Blocker Passed - 01/31/2022  9:24 AM      Passed - PSA in normal range and within 360 days    PSA  Date Value Ref Range Status  09/06/2021 0.48 < OR = 4.00 ng/mL Final    Comment:    The total PSA value from this assay system is  standardized against the WHO standard. The test  result will be approximately 20% lower when compared  to the equimolar-standardized total PSA (Beckman  Coulter). Comparison of serial PSA results should be  interpreted with this fact in mind. . This test was performed using the Siemens  chemiluminescent method. Values obtained from  different assay methods cannot be used interchangeably. PSA levels, regardless of value, should not be interpreted as absolute evidence of the presence or absence of disease.    Prostate Specific Ag, Serum  Date Value Ref Range Status  08/30/2019 0.9 0.0 - 4.0 ng/mL Final    Comment:    Roche ECLIA methodology. According to the American Urological Association, Serum PSA should decrease and remain at undetectable levels after radical prostatectomy. The AUA defines biochemical recurrence as an initial PSA value 0.2 ng/mL or greater followed by a subsequent confirmatory PSA value 0.2 ng/mL or greater. Values obtained with different assay methods or kits cannot be used interchangeably. Results cannot be interpreted as absolute evidence of the presence or absence of malignant disease.          Passed - Last BP in normal range    BP Readings from Last 1 Encounters:  09/13/21 (!) 118/53         Passed - Valid encounter within last 12 months    Recent Outpatient Visits          4 months ago Annual physical exam   [place], DO   11 months ago Left nephrolithiasis   [place], Devonne Doughty, DO   1 year ago Annual physical exam   [facility] Olin Hauser, DO   2 years ago Annual physical exam   [facility] Olin Hauser, DO   3 years ago Chills without fever   [facility] Mikey College, NP      Future Appointments            In 3 months Ralene Bathe, MD [place]   In 7 months [facility], [facility]

## 2022-02-19 ENCOUNTER — Other Ambulatory Visit: Payer: Self-pay | Admitting: Dermatology

## 2022-02-19 DIAGNOSIS — L719 Rosacea, unspecified: Secondary | ICD-10-CM

## 2022-02-19 DIAGNOSIS — L739 Follicular disorder, unspecified: Secondary | ICD-10-CM

## 2022-05-21 ENCOUNTER — Ambulatory Visit: Payer: Managed Care, Other (non HMO) | Admitting: Dermatology

## 2022-05-21 DIAGNOSIS — L814 Other melanin hyperpigmentation: Secondary | ICD-10-CM

## 2022-05-21 DIAGNOSIS — D485 Neoplasm of uncertain behavior of skin: Secondary | ICD-10-CM

## 2022-05-21 DIAGNOSIS — L719 Rosacea, unspecified: Secondary | ICD-10-CM

## 2022-05-21 DIAGNOSIS — Z1283 Encounter for screening for malignant neoplasm of skin: Secondary | ICD-10-CM | POA: Diagnosis not present

## 2022-05-21 DIAGNOSIS — D492 Neoplasm of unspecified behavior of bone, soft tissue, and skin: Secondary | ICD-10-CM

## 2022-05-21 DIAGNOSIS — L578 Other skin changes due to chronic exposure to nonionizing radiation: Secondary | ICD-10-CM

## 2022-05-21 MED ORDER — DOXYCYCLINE HYCLATE 20 MG PO TABS: 20.0000 mg | ORAL_TABLET | Freq: Two times a day (BID) | ORAL | 4 refills | Status: DC

## 2022-05-21 NOTE — Patient Instructions (Addendum)

## 2022-05-21 NOTE — Progress Notes (Signed)
Follow-Up Visit   Subjective  Scott Sanford is a 56 y.o. male who presents for the following: Total body skin exam and Rosacea (Face, Doxycycline '20mg'$  1 po bid). The patient presents for Total-Body Skin Exam (TBSE) for skin cancer screening and mole check.  The patient has spots, moles and lesions to be evaluated, some may be new or changing and the patient has concerns that these could be cancer.   The following portions of the chart were reviewed this encounter and updated as appropriate:   Tobacco  Allergies  Meds  Problems  Med Hx  Surg Hx  Fam Hx     Review of Systems:  No other skin or systemic complaints except as noted in HPI or Assessment and Plan.  Objective  Well appearing patient in no apparent distress; mood and affect are within normal limits.  A full examination was performed including scalp, head, eyes, ears, nose, lips, neck, chest, axillae, abdomen, back, buttocks, bilateral upper extremities, bilateral lower extremities, hands, feet, fingers, toes, fingernails, and toenails. All findings within normal limits unless otherwise noted below.  Head - Anterior (Face) Erythema face  posterior neck 0.6cm crusted pap      Assessment & Plan  Rosacea Head - Anterior (Face)  Rosacea is a chronic progressive skin condition usually affecting the face of adults, causing redness and/or acne bumps. It is treatable but not curable. It sometimes affects the eyes (ocular rosacea) as well. It may respond to topical and/or systemic medication and can flare with stress, sun exposure, alcohol, exercise and some foods.  Daily application of broad spectrum spf 30+ sunscreen to face is recommended to reduce flares.  Discussed the treatment option of BBL/laser.  Typically we recommend 1-3 treatment sessions about 5-8 weeks apart for best results.  The patient's condition may require "maintenance treatments" in the future.  The fee for BBL / laser treatments is $350 per treatment  session for the whole face.  A fee can be quoted for other parts of the body. Insurance typically does not pay for BBL/laser treatments and therefore the fee is an out-of-pocket cost.  Chronic and persistent condition with duration or expected duration over one year. Condition is symptomatic/ bothersome to patient. Not currently at goal, but improved.  Cont Doxycyline '20mg'$  1 po bid with food and drink  Doxycycline should be taken with food to prevent nausea. Do not lay down for 30 minutes after taking. Be cautious with sun exposure and use good sun protection while on this medication. Pregnant women should not take this medication.     Related Medications doxycycline (PERIOSTAT) 20 MG tablet Take 1 tablet (20 mg total) by mouth 2 (two) times daily. Take with food and drink  Neoplasm of skin posterior neck  Epidermal / dermal shaving  Lesion diameter (cm):  0.6 Informed consent: discussed and consent obtained   Timeout: patient name, date of birth, surgical site, and procedure verified   Procedure prep:  Patient was prepped and draped in usual sterile fashion Prep type:  Isopropyl alcohol Anesthesia: the lesion was anesthetized in a standard fashion   Anesthetic:  1% lidocaine w/ epinephrine 1-100,000 buffered w/ 8.4% NaHCO3 Instrument used: flexible razor blade   Hemostasis achieved with: pressure, aluminum chloride and electrodesiccation   Outcome: patient tolerated procedure well   Post-procedure details: sterile dressing applied and wound care instructions given   Dressing type: bandage and petrolatum    Specimen 1 - Surgical pathology Differential Diagnosis: D48.5 [place] r/o Cancer  Check Margins: No 0.6cm crusted pap  Lentigines - Scattered tan macules - Due to sun exposure - Benign-appering, observe - Recommend daily broad spectrum sunscreen SPF 30+ to sun-exposed areas, reapply every 2 hours as needed. - Call for any changes  Actinic Damage - chronic, secondary to  cumulative UV radiation exposure/sun exposure over time - diffuse scaly erythematous macules with underlying dyspigmentation - Recommend daily broad spectrum sunscreen SPF 30+ to sun-exposed areas, reapply every 2 hours as needed.  - Recommend staying in the shade or wearing long sleeves, sun glasses (UVA+UVB protection) and wide brim hats (4-inch brim around the entire circumference of the hat). - Call for new or changing lesions.  Return in about 1 year (around 05/22/2023) for TBSE.  I, Scott Sanford, RMA, am acting as scribe for Scott Ser, MD . Documentation: I have reviewed the above documentation for accuracy and completeness, and I agree with the above.  Scott Ser, MD

## 2022-05-23 ENCOUNTER — Encounter: Payer: Self-pay | Admitting: Dermatology

## 2022-05-25 ENCOUNTER — Encounter: Payer: Self-pay | Admitting: Dermatology

## 2022-08-17 ENCOUNTER — Ambulatory Visit: Payer: Managed Care, Other (non HMO)

## 2022-09-09 ENCOUNTER — Other Ambulatory Visit: Payer: Self-pay

## 2022-09-09 DIAGNOSIS — I10 Essential (primary) hypertension: Secondary | ICD-10-CM

## 2022-09-09 DIAGNOSIS — Z0000 Encounter for general adult medical examination without abnormal findings: Secondary | ICD-10-CM

## 2022-09-09 DIAGNOSIS — R7309 Other abnormal glucose: Secondary | ICD-10-CM

## 2022-09-09 DIAGNOSIS — Z125 Encounter for screening for malignant neoplasm of prostate: Secondary | ICD-10-CM

## 2022-09-09 DIAGNOSIS — E782 Mixed hyperlipidemia: Secondary | ICD-10-CM

## 2022-09-10 ENCOUNTER — Other Ambulatory Visit: Payer: Managed Care, Other (non HMO)

## 2022-09-11 LAB — CBC WITH DIFFERENTIAL/PLATELET
Absolute Monocytes: 408 cells/uL (ref 200–950)
Basophils Absolute: 20 cells/uL (ref 0–200)
Basophils Relative: 0.5 %
Eosinophils Absolute: 48 cells/uL (ref 15–500)
Eosinophils Relative: 1.2 %
HCT: 47.8 % (ref 38.5–50.0)
Hemoglobin: 16 g/dL (ref 13.2–17.1)
Lymphs Abs: 1660 cells/uL (ref 850–3900)
MCH: 31.7 pg (ref 27.0–33.0)
MCHC: 33.5 g/dL (ref 32.0–36.0)
MCV: 94.8 fL (ref 80.0–100.0)
MPV: 9.7 fL (ref 7.5–12.5)
Monocytes Relative: 10.2 %
Neutro Abs: 1864 cells/uL (ref 1500–7800)
Neutrophils Relative %: 46.6 %
Platelets: 153 10*3/uL (ref 140–400)
RBC: 5.04 10*6/uL (ref 4.20–5.80)
RDW: 13.1 % (ref 11.0–15.0)
Total Lymphocyte: 41.5 %
WBC: 4 10*3/uL (ref 3.8–10.8)

## 2022-09-11 LAB — LIPID PANEL
Cholesterol: 170 mg/dL (ref <200)
HDL: 45 mg/dL (ref >=40)
LDL Cholesterol (Calc): 108 mg/dL (calc) — ABNORMAL HIGH
Non-HDL Cholesterol (Calc): 125 mg/dL (calc) (ref <130)
Total CHOL/HDL Ratio: 3.8 (calc) (ref <5.0)
Triglycerides: 83 mg/dL (ref <150)

## 2022-09-11 LAB — COMPREHENSIVE METABOLIC PANEL
AG Ratio: 2.1 (calc) (ref 1.0–2.5)
ALT: 69 U/L — ABNORMAL HIGH (ref 9–46)
AST: 37 U/L — ABNORMAL HIGH (ref 10–35)
Albumin: 4.5 g/dL (ref 3.6–5.1)
Alkaline phosphatase (APISO): 109 U/L (ref 35–144)
BUN: 17 mg/dL (ref 7–25)
CO2: 26 mmol/L (ref 20–32)
Calcium: 9.5 mg/dL (ref 8.6–10.3)
Chloride: 106 mmol/L (ref 98–110)
Creat: 1.02 mg/dL (ref 0.70–1.30)
Globulin: 2.1 g/dL (calc) (ref 1.9–3.7)
Glucose, Bld: 84 mg/dL (ref 65–99)
Potassium: 4 mmol/L (ref 3.5–5.3)
Sodium: 141 mmol/L (ref 135–146)
Total Bilirubin: 1.2 mg/dL (ref 0.2–1.2)
Total Protein: 6.6 g/dL (ref 6.1–8.1)

## 2022-09-11 LAB — HEMOGLOBIN A1C
Hgb A1c MFr Bld: 5.7 % of total Hgb — ABNORMAL HIGH (ref <5.7)
Mean Plasma Glucose: 117 mg/dL
eAG (mmol/L): 6.5 mmol/L

## 2022-09-11 LAB — PSA: PSA: 0.54 ng/mL (ref <=4.00)

## 2022-09-11 LAB — TSH: TSH: 1.86 mIU/L (ref 0.40–4.50)

## 2022-09-17 ENCOUNTER — Ambulatory Visit (INDEPENDENT_AMBULATORY_CARE_PROVIDER_SITE_OTHER): Payer: Managed Care, Other (non HMO) | Admitting: Family Medicine

## 2022-09-17 ENCOUNTER — Other Ambulatory Visit: Payer: Self-pay | Admitting: Family Medicine

## 2022-09-17 ENCOUNTER — Encounter: Payer: Self-pay | Admitting: Family Medicine

## 2022-09-17 VITALS — BP 130/70 | HR 59 | Ht 71.0 in | Wt 362.5 lb

## 2022-09-17 DIAGNOSIS — Z23 Encounter for immunization: Secondary | ICD-10-CM | POA: Diagnosis not present

## 2022-09-17 DIAGNOSIS — K219 Gastro-esophageal reflux disease without esophagitis: Secondary | ICD-10-CM

## 2022-09-17 DIAGNOSIS — R748 Abnormal levels of other serum enzymes: Secondary | ICD-10-CM

## 2022-09-17 DIAGNOSIS — M1A09X0 Idiopathic chronic gout, multiple sites, without tophus (tophi): Secondary | ICD-10-CM

## 2022-09-17 DIAGNOSIS — Z6841 Body Mass Index (BMI) 40.0 and over, adult: Secondary | ICD-10-CM

## 2022-09-17 DIAGNOSIS — Z0000 Encounter for general adult medical examination without abnormal findings: Secondary | ICD-10-CM

## 2022-09-17 DIAGNOSIS — I10 Essential (primary) hypertension: Secondary | ICD-10-CM

## 2022-09-17 DIAGNOSIS — E782 Mixed hyperlipidemia: Secondary | ICD-10-CM

## 2022-09-17 DIAGNOSIS — G4733 Obstructive sleep apnea (adult) (pediatric): Secondary | ICD-10-CM

## 2022-09-17 DIAGNOSIS — Z1211 Encounter for screening for malignant neoplasm of colon: Secondary | ICD-10-CM | POA: Diagnosis not present

## 2022-09-17 DIAGNOSIS — R7309 Other abnormal glucose: Secondary | ICD-10-CM

## 2022-09-17 DIAGNOSIS — N200 Calculus of kidney: Secondary | ICD-10-CM

## 2022-09-17 MED ORDER — TELMISARTAN 80 MG PO TABS: 80.0000 mg | ORAL_TABLET | Freq: Every day | ORAL | 3 refills | Status: DC

## 2022-09-17 MED ORDER — ALLOPURINOL 300 MG PO TABS: 300.0000 mg | ORAL_TABLET | Freq: Every day | ORAL | 3 refills | Status: DC

## 2022-09-17 MED ORDER — OMEPRAZOLE 20 MG PO CPDR: 20.0000 mg | DELAYED_RELEASE_CAPSULE | Freq: Every day | ORAL | 3 refills | Status: DC

## 2022-09-17 MED ORDER — METOPROLOL SUCCINATE ER 50 MG PO TB24: 50.0000 mg | ORAL_TABLET | Freq: Every day | ORAL | 3 refills | Status: DC

## 2022-09-17 NOTE — Progress Notes (Unsigned)
Subjective:    Patient ID: Scott Sanford, male    DOB: Oct 15, 1966, 56 y.o.   MRN: [id number]  Scott Sanford is a 56 y.o. male presenting on 09/17/2022 for Annual Exam   HPI  Here for Annual Physical and Lab Review.   CHRONIC HTN: No new concerns. Prior blood pressure dose metoprolol '100mg'$  back in 2018 but has done well at '50mg'$  Repeat reading is normal today, home BP readings reviewed Current Meds - Metoprolol XL '50mg'$  daily, Telmisartan '80mg'$  daily   Reports good compliance, took meds today. Tolerating well, w/o complaints.  ASCVD Takes ASA '81mg'$  daily Fam history mother with MI   OSA, on BiPAP -  Previously seen by Pulmonology had PSG in 2006, then new machine in 2010, has been on BiPAP for long time now, doing well on it - Today he reports that sleep apnea is well controlled. He uses the BiPAP machine every night. Tolerates the machine well, and thinks that sleeps better with it and feels good. No new concerns or symptoms.   HYPERLIPIDEMIA  - Reports no concerns. Last lipid panel 08/2022 improved lipids, total cholesterol and TG down - Currently taking Fish Oil 2400 BID, tolerating well without side effects or myalgias - Never on Statin - Taking ASA '81mg'$  daily  Fam history Father with elevated ALT AST Not interested in RUQ [place] at this time   Vitamin D Last lab normal range, currently taking Vitamin D3 2,000 unit daily supplement.   Morbid Obesity BMI >50 A1c at 5.7 Interval improved weight loss. Diet: Reducing portion size Exercise: less active, limited walking due to Right leg inner groin, from prior back injury, he has persistent issue with ambulation causing him to limp. 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   PMH - History of Gout / Uric Acid Nephrolithiasis (Left) - on allopurinol, no recent  flare, possible kidney stone recently since passed stone he believes as it resolved. Rarely takes a hydrocodone from prior stone episode.   Prior history back pain, followed by [facility] 01/2019 last, was going to do PT for piriformis syndrome, but cancelled d/t covid he has not returned yet He is now interested in PT option He has been on muscle relaxant PRN in past, has used supply from 1-2 year ago and now running low, takes infrequently.  DISH*** Spine  ***old injury cannot straighten R leg and weakness  ***toe 3 mo derm kow     Health Maintenance:   Due Flu vaccine, will get today   UTD Shingrix.   Colon CA Screening: Cologuard completed 10/22/19 - Negative, next due in 09/2022 will order today   Prostate CA Screening: Prior PSA / DRE reported normal, Last PSA 0.54 (08/2022). Currently asymptomatic. No known family history of prostate CA.  Hx kidney stone Taking uric acid cleanse   Prior history of Pneumonia vaccine in 2006 after significant episode of pneumonia. Not indicated for pneumonia vaccine at this time.     09/17/2022    8:57 AM 09/13/2021   10:04 AM 09/03/2020    9:27 AM  Depression screen PHQ 2/9  Decreased Interest 0 0 0  Down, Depressed, Hopeless 0 0 0  PHQ - 2 Score 0 0 0  Altered sleeping 0 0   Tired, decreased energy 0 0   Change in appetite 0  0   Feeling bad or failure about yourself  0 0   Trouble concentrating 0 0   Moving slowly or fidgety/restless 0 0   Suicidal thoughts 0 0   PHQ-9 Score 0 0   Difficult doing work/chores Not difficult at all Not difficult at all     Past Medical History:  Diagnosis Date  . 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 Sleep apnea    Past Surgical History:  Procedure Laterality Date  . CHOLECYSTECTOMY  2006   Gallstones  . TONSILLECTOMY  1984   Social History   Socioeconomic History  . Marital status: Divorced    Spouse name: Not on file  . Number of  children: Not on file  . Years of education: College  . Highest education level: Bachelor's degree (e.g., BA, AB, BS)  Occupational History  . Occupation: [employer] Dept  Tobacco Use  . Smoking status: Never  . Smokeless tobacco: Never  Vaping Use  . Vaping Use: Never used  Substance and Sexual Activity  . Alcohol use: Yes    Alcohol/week: 1.0 standard drink of alcohol    Types: 1 Standard drinks or equivalent per week    Comment: occasionally  . Drug use: No  . Sexual activity: Not on file  Other Topics Concern  . Not on file  Social History Narrative  . Not on file   Social Determinants of Health   Financial Resource Strain: Not on file  Food Insecurity: Not on file  Transportation Needs: Not on file  Physical Activity: Not on file  Stress: Not on file  Social Connections: Not on file  Intimate Partner Violence: Not on file   Family History  Problem Relation Age of Onset  . Lung cancer Mother        2015, apex of lung surgical removal  . Diabetes Father   . Dementia Paternal Grandmother   . Stroke Paternal Grandfather   . Prostate cancer Neg Hx   . Colon cancer Neg Hx    Current Outpatient Medications on File Prior to Visit  Medication Sig  . aspirin 81 MG chewable tablet Chew by mouth daily.  . Cholecalciferol (VITAMIN D) 50 MCG (2000 UT) CAPS Take 2,000 Units by mouth daily.  . clindamycin (CLEOCIN T) 1 % external solution USED TOPICALLY TO SCALP ON ANY CRUSTED AREA ONCE DAILY AS NEEDED  . cyclobenzaprine (FLEXERIL) 10 MG tablet Take 1 tablet (10 mg total) by mouth 3 (three) times daily as needed for muscle spasms.  . fluticasone (FLONASE) 50 MCG/ACT nasal spray Place 2 sprays into both nostrils daily. Use for 4-6 weeks then stop and use seasonally or as needed.  . Ginkgo Biloba 40 MG TABS Take by mouth.  Marland Kitchen ketorolac (TORADOL) 10 MG tablet Take 1 tablet (10 mg total) by mouth every 6 (six) hours as needed for moderate pain (kidney stone).  .  Multiple Vitamin (MULTIVITAMIN) capsule Take 1 capsule by mouth daily.  . Omega-3 Fatty Acids (FISH OIL PO) Take 2,400 mg by mouth 2 (two) times daily.  . Probiotic Product (PROBIOTIC-10 PO) Take by mouth.  . tamsulosin (FLOMAX) 0.4 MG CAPS capsule TAKE 1 CAPSULE (0.4 MG TOTAL) BY MOUTH DAILY AS NEEDED (KIDNEY STONE).   No current facility-administered medications on file prior to visit.    Review of Systems  Constitutional:  Negative for activity change, appetite change, chills, diaphoresis, fatigue and fever.  HENT:  Negative for congestion and hearing loss.   Eyes:  Negative for visual disturbance.  Respiratory:  Negative for cough, chest tightness, shortness of breath and wheezing.   Cardiovascular:  Negative for chest pain, palpitations and leg swelling.  Gastrointestinal:  Negative for abdominal pain, constipation, diarrhea, nausea and vomiting.  Genitourinary:  Negative for dysuria, frequency and hematuria.  Musculoskeletal:  Negative for arthralgias and neck pain.  Skin:  Negative for rash.  Neurological:  Negative for dizziness, weakness, light-headedness, numbness and headaches.  Hematological:  Negative for adenopathy.  Psychiatric/Behavioral:  Negative for behavioral problems, dysphoric mood and sleep disturbance.    Per HPI unless specifically indicated above      Objective:    BP 130/70 (BP Location: Left Arm, Cuff Size: Normal)   Pulse (!) 59   Ht '5\' 11"'$  (1.803 m)   Wt (!) 362 lb 8 oz (164.4 kg)   SpO2 98%   BMI 50.56 kg/m   Wt Readings from Last 3 Encounters:  09/17/22 (!) 362 lb 8 oz (164.4 kg)  09/13/21 (!) 366 lb (166 kg)  03/04/21 (!) 374 lb 12.8 oz (170 kg)    Physical Exam Vitals and nursing note reviewed.  Constitutional:      General: He is not in acute distress.    Appearance: He is well-developed. He is obese. He is not diaphoretic.     Comments: Well-appearing, comfortable, cooperative  HENT:     Head: Normocephalic and atraumatic.  Eyes:      General:        Right eye: No discharge.        Left eye: No discharge.     Conjunctiva/sclera: Conjunctivae normal.     Pupils: Pupils are equal, round, and reactive to light.  Neck:     Thyroid: No thyromegaly.     Vascular: No carotid bruit.  Cardiovascular:     Rate and Rhythm: Normal rate and regular rhythm.     Pulses: Normal pulses.     Heart sounds: Normal heart sounds. No murmur heard. Pulmonary:     Effort: Pulmonary effort is normal. No respiratory distress.     Breath sounds: Normal breath sounds. No wheezing or rales.  Abdominal:     General: Bowel sounds are normal. There is no distension.     Palpations: Abdomen is soft. There is no mass.     Tenderness: There is no abdominal tenderness.  Musculoskeletal:        General: No tenderness. Normal range of motion.     Cervical back: Normal range of motion and neck supple.     Right lower leg: No edema.     Left lower leg: No edema.     Comments: Upper / Lower Extremities: - Normal muscle tone, strength bilateral upper extremities 5/5, lower extremities 5/5  Lymphadenopathy:     Cervical: No cervical adenopathy.  Skin:    General: Skin is warm and dry.     Findings: No erythema or rash.  Neurological:     Mental Status: He is alert and oriented to person, place, and time.     Comments: Distal sensation intact to light touch all extremities  Psychiatric:        Mood and Affect: Mood normal.        Behavior: Behavior normal.        Thought Content: Thought content normal.     Comments: Well groomed, good eye contact, normal speech and thoughts      Results for  orders placed or performed in visit on 09/09/22  TSH  Result Value Ref Range   TSH 1.86 0.40 - 4.50 mIU/L  PSA  Result Value Ref Range   PSA 0.54 < OR = 4.00 ng/mL  Lipid panel  Result Value Ref Range   Cholesterol 170 <200 mg/dL   HDL 45 > OR = 40 mg/dL   Triglycerides 83 <150 mg/dL   LDL Cholesterol (Calc) 108 (H) mg/dL (calc)   Total CHOL/HDL  Ratio 3.8 <5.0 (calc)   Non-HDL Cholesterol (Calc) 125 <130 mg/dL (calc)  Comprehensive Metabolic Panel (CMET)  Result Value Ref Range   Glucose, Bld 84 65 - 99 mg/dL   BUN 17 7 - 25 mg/dL   Creat 1.02 0.70 - 1.30 mg/dL   BUN/Creatinine Ratio SEE NOTE: 6 - 22 (calc)   Sodium 141 135 - 146 mmol/L   Potassium 4.0 3.5 - 5.3 mmol/L   Chloride 106 98 - 110 mmol/L   CO2 26 20 - 32 mmol/L   Calcium 9.5 8.6 - 10.3 mg/dL   Total Protein 6.6 6.1 - 8.1 g/dL   Albumin 4.5 3.6 - 5.1 g/dL   Globulin 2.1 1.9 - 3.7 g/dL (calc)   AG Ratio 2.1 1.0 - 2.5 (calc)   Total Bilirubin 1.2 0.2 - 1.2 mg/dL   Alkaline phosphatase (APISO) 109 35 - 144 U/L   AST 37 (H) 10 - 35 U/L   ALT 69 (H) 9 - 46 U/L  CBC with Differential  Result Value Ref Range   WBC 4.0 3.8 - 10.8 Thousand/uL   RBC 5.04 4.20 - 5.80 Million/uL   Hemoglobin 16.0 13.2 - 17.1 g/dL   HCT 47.8 38.5 - 50.0 %   MCV 94.8 80.0 - 100.0 fL   MCH 31.7 27.0 - 33.0 pg   MCHC 33.5 32.0 - 36.0 g/dL   RDW 13.1 11.0 - 15.0 %   Platelets 153 140 - 400 Thousand/uL   MPV 9.7 7.5 - 12.5 fL   Neutro Abs 1,864 1,500 - 7,800 cells/uL   Lymphs Abs 1,660 850 - 3,900 cells/uL   Absolute Monocytes 408 200 - 950 cells/uL   Eosinophils Absolute 48 15 - 500 cells/uL   Basophils Absolute 20 0 - 200 cells/uL   Neutrophils Relative % 46.6 %   Total Lymphocyte 41.5 %   Monocytes Relative 10.2 %   Eosinophils Relative 1.2 %   Basophils Relative 0.5 %  HgB A1c  Result Value Ref Range   Hgb A1c MFr Bld 5.7 (H) <5.7 % of total Hgb   Mean Plasma Glucose 117 mg/dL   eAG (mmol/L) 6.5 mmol/L      Assessment & Plan:   Problem List Items Addressed This Visit     Elevated hemoglobin A1c   Essential hypertension   Hyperlipidemia   Morbid obesity with BMI of 50.0-59.9, adult (HCC)   OSA treated with BiPAP   Other Visit Diagnoses     Annual physical exam    -  Primary   Needs flu shot       Relevant Orders   Flu Vaccine QUAD 93moIM (Fluarix, Fluzone &  Alfiuria Quad PF)   Screening for colon cancer       Relevant Orders   Cologuard   Elevated liver enzymes           Updated Health Maintenance information Reviewed recent lab results with patient Encouraged improvement to lifestyle with diet and exercise Goal of weight loss   No orders of  the defined types were placed in this encounter.     Follow up plan: Return in about 1 year (around 09/18/2023) for 1 year fasting lab only then 1 week later Annual Physical.  Nobie Putnam, DO [facility] 09/17/2022, 9:00 AM

## 2022-09-17 NOTE — Patient Instructions (Addendum)
Thank you for coming to the office today.  For the toe spot, recommend that you call and schedule with Dr Nehemiah Massed. I would be okay if they can see you within 3 months that is fine if it is longer than let me know we can try to help I am reassured it is probably toenail injury and takes time to heal and new nail to grow in, bottom half of nail looks great.  Future liver enzymes if elevated again we can check ultrasound, but now they are improved and stable.  BP improved on recheck. No dose changes.  Flu Shot today  Ordered the Cologuard (home kit) test for colon cancer screening. Stay tuned for further updates.  It will be shipped to you directly. If not received in 2-4 weeks, call us or the company.   If you send it back and no results are received in 2-4 weeks, call us or the company as well!   Colon Cancer Screening: - For all adults age 62+ routine colon cancer screening is highly recommended.     - Recent guidelines from [place] recommend starting age of 53 - Early detection of colon cancer is important, because often there are no warning signs or symptoms, also if found early usually it can be cured. Late stage is hard to treat.   - If Cologuard is NEGATIVE, then it is good for 3 years before next due - If Cologuard is POSITIVE, then it is strongly advised to get a Colonoscopy, which allows the GI doctor to locate the source of the cancer or polyp (even very early stage) and treat it by removing it. ------------------------- Follow instructions to collect sample, you may call the company for any help or questions, 24/7 telephone support at [phone].  DUE for FASTING BLOOD WORK (no food or drink after midnight before the lab appointment, only water or coffee without cream/sugar on the morning of)  SCHEDULE "Lab Only" visit in the morning at the clinic for lab draw in 1 YEAR  - Make sure Lab Only appointment is at about 1 week before your next appointment, so  that results will be available  For Lab Results, once available within 2-3 days of blood draw, you can can log in to MyChart online to view your results and a brief explanation. Also, we can discuss results at next follow-up visit.   Please schedule a Follow-up Appointment to: Return in about 1 year (around 09/18/2023) for 1 year fasting lab only then 1 week later Annual Physical.  If you have any other questions or concerns, please feel free to call the office or send a message through [place]. You may also schedule an earlier appointment if necessary.  Additionally, you may be receiving a survey about your experience at our office within a few days to 1 week by e-mail or mail. We value your feedback.  Nobie Putnam, DO [place]

## 2022-09-18 ENCOUNTER — Telehealth: Payer: Managed Care, Other (non HMO)

## 2022-09-18 NOTE — Assessment & Plan Note (Signed)
Well controlled, chronic OSA on BiPAP, now >15 years, had initial PSG 2006 - Good adherence to BiPAP nightly - Continue current BiPAP therapy, patient seems to be benefiting from therapy - In future if needs update will consider referral to Pulmonology given morbid obesity likely factor and on BiPAP

## 2022-09-18 NOTE — Assessment & Plan Note (Signed)
Stable, A1c 5.7, below range of PreDM Concern with obesity, HTN, HLD  Plan:  1. Not on any therapy currently  2. Encourage improved lifestyle - low carb, low sugar diet, reduce portion size, continue improving exercise

## 2022-09-18 NOTE — Assessment & Plan Note (Signed)
Controlled HTN No known complications - Failed Lisinopril-HCTZ - side effects     Plan:  1. Continue current BP regimen - Metoprolol XL '50mg'$  daily, Telmisartan '80mg'$  daily 2. Encourage improved lifestyle - low sodium diet, improve regular exercise 3. Continue to monitor BP outside office, bring readings to next visit, if persistently >140/90 or new symptoms notify office sooner

## 2022-09-19 ENCOUNTER — Ambulatory Visit: Payer: Managed Care, Other (non HMO) | Admitting: Physician Assistant

## 2022-09-19 ENCOUNTER — Encounter: Payer: Self-pay | Admitting: Physician Assistant

## 2022-09-19 VITALS — BP 161/82 | HR 61 | Ht 71.0 in | Wt 362.0 lb

## 2022-09-19 DIAGNOSIS — N410 Acute prostatitis: Secondary | ICD-10-CM | POA: Diagnosis not present

## 2022-09-19 LAB — POCT URINALYSIS DIPSTICK
Bilirubin, UA: NEGATIVE
Blood, UA: NEGATIVE
Glucose, UA: NEGATIVE
Ketones, UA: NEGATIVE
Leukocytes, UA: NEGATIVE
Nitrite, UA: NEGATIVE
Protein, UA: NEGATIVE
Spec Grav, UA: 1.025 (ref 1.010–1.025)
Urobilinogen, UA: 0.2 E.U./dL
pH, UA: 6 (ref 5.0–8.0)

## 2022-09-19 MED ORDER — SULFAMETHOXAZOLE-TRIMETHOPRIM 800-160 MG PO TABS: 1.0000 | ORAL_TABLET | Freq: Two times a day (BID) | ORAL | 0 refills | Status: AC

## 2022-09-19 NOTE — Progress Notes (Signed)
Acute Office Visit   Patient: Scott Sanford   DOB: 1966/03/09   56 y.o. Male  MRN: [id number] Visit Date: 09/19/2022  Today's healthcare provider: Dani Gobble Telsa Dillavou, PA-C  Introduced myself to the patient as a Journalist, newspaper and provided education on APPs in clinical practice.    Chief Complaint  Patient presents with   prostate pain   Subjective    HPI   Reports he has a history of prostatitis  States in the past he has typically had this after a kidney stone   Reports he started feeling numbness, then some tightness and pain about 2 days ago Reports he feels this in perineum area and to the left of gluteal cleft Pain level: more uncomfortable and aggravating  Aggravating: sifting directly on buttocks for long periods of time Alleviating: reclining and changing positions Reports he feels like he needs to urinate a lot and is having some burning with urination Interventions: aleve and motrin Reports his ejaculate had a yellowish tint to it.   States he is taking Doxycycline 20 mg PO BID for rosacea from [facility]  Medications: Outpatient Medications Prior to Visit  Medication Sig   allopurinol (ZYLOPRIM) 300 MG tablet Take 1 tablet (300 mg total) by mouth daily.   aspirin 81 MG chewable tablet Chew by mouth daily.   Cholecalciferol (VITAMIN D) 50 MCG (2000 UT) CAPS Take 2,000 Units by mouth daily.   clindamycin (CLEOCIN T) 1 % external solution USED TOPICALLY TO SCALP ON ANY CRUSTED AREA ONCE DAILY AS NEEDED   cyclobenzaprine (FLEXERIL) 10 MG tablet Take 1 tablet (10 mg total) by mouth 3 (three) times daily as needed for muscle spasms.   fluticasone (FLONASE) 50 MCG/ACT nasal spray Place 2 sprays into both nostrils daily. Use for 4-6 weeks then stop and use seasonally or as needed.   Ginkgo Biloba 40 MG TABS Take by mouth.   ketorolac (TORADOL) 10 MG tablet Take 1 tablet (10 mg total) by mouth every 6 (six) hours as needed for moderate pain (kidney stone).    metoprolol succinate (TOPROL-XL) 50 MG 24 hr tablet Take 1 tablet (50 mg total) by mouth daily. TAKE WITH OR IMMEDIATELY FOLLOWING A MEAL.   Multiple Vitamin (MULTIVITAMIN) capsule Take 1 capsule by mouth daily.   Omega-3 Fatty Acids (FISH OIL PO) Take 2,400 mg by mouth 2 (two) times daily.   omeprazole (PRILOSEC) 20 MG capsule Take 1 capsule (20 mg total) by mouth daily before breakfast.   Probiotic Product (PROBIOTIC-10 PO) Take by mouth.   tamsulosin (FLOMAX) 0.4 MG CAPS capsule TAKE 1 CAPSULE (0.4 MG TOTAL) BY MOUTH DAILY AS NEEDED (KIDNEY STONE).   telmisartan (MICARDIS) 80 MG tablet Take 1 tablet (80 mg total) by mouth at bedtime.   No facility-administered medications prior to visit.    Review of Systems  Constitutional:  Negative for chills and fever.  Gastrointestinal:  Negative for diarrhea, nausea and vomiting.  Genitourinary:  Positive for dysuria, frequency and urgency. Negative for difficulty urinating, enuresis, flank pain, penile discharge, penile pain, penile swelling, scrotal swelling and testicular pain.  Neurological:  Negative for dizziness, light-headedness and headaches.       Objective    BP (!) 161/82   Pulse 61   Ht '5\' 11"'$  (1.803 m)   Wt (!) 362 lb (164.2 kg)   SpO2 99%   BMI 50.49 kg/m    Physical Exam Vitals reviewed.  Constitutional:  General: He is awake.     Appearance: Normal appearance. He is well-developed and well-groomed. He is morbidly obese.  HENT:     Head: Normocephalic and atraumatic.  Pulmonary:     Effort: Pulmonary effort is normal.  Abdominal:     General: Abdomen is flat. Bowel sounds are normal.     Palpations: Abdomen is soft.     Tenderness: There is no abdominal tenderness.  Genitourinary:    Comments: Patient declined today due to concerns for pain. DRE was postponed with mutual decision making  Musculoskeletal:     Cervical back: Normal range of motion and neck supple.  Neurological:     Mental Status: He is  alert.  Psychiatric:        Attention and Perception: Attention and perception normal.        Mood and Affect: Mood and affect normal.        Speech: Speech normal.        Behavior: Behavior normal. Behavior is cooperative.        Thought Content: Thought content normal.       Results for orders placed or performed in visit on 09/19/22  POCT Urinalysis Dipstick  Result Value Ref Range   Color, UA Yellow    Clarity, UA Clear    Glucose, UA Negative Negative   Bilirubin, UA Negative    Ketones, UA Negative    Spec Grav, UA 1.025 1.010 - 1.025   Blood, UA Negative    pH, UA 6.0 5.0 - 8.0   Protein, UA Negative Negative   Urobilinogen, UA 0.2 0.2 or 1.0 E.U./dL   Nitrite, UA Negative    Leukocytes, UA Negative Negative   Appearance     Odor      Assessment & Plan      No follow-ups on file.      Problem List Items Addressed This Visit       Genitourinary   Acute prostatitis - Primary    Acute, recurrent per patient Patient states he has a hx of prostatitis and this feels similar to those instances  UA was negative and pt declined DRE due to concerns for pain Will check CBC, CMP, PSA for signs of acute illness and elevations to corroborate his concerns-results to dictate further management, may need referral to Urology if symptoms persist or recur.  Patient was adamant that this is consistent with previous bouts of prostatitis and is concerned that he will need abx to reduce severity Since it is weekend I will place order for bactrim for 2 weeks with goal of improving symptoms  Recommend follow up for progressing or persistent symptoms       Relevant Medications   sulfamethoxazole-trimethoprim (BACTRIM DS) 800-160 MG tablet   Other Relevant Orders   CBC w/Diff/Platelet   COMPLETE METABOLIC PANEL WITH GFR   PSA   POCT Urinalysis Dipstick (Completed)     No follow-ups on file.   I, Shaylan Tutton E Tenya Araque, PA-C, have reviewed all documentation for this visit. The  documentation on 09/19/22 for the exam, diagnosis, procedures, and orders are all accurate and complete.   Talitha Givens, MHS, PA-C [facility]

## 2022-09-19 NOTE — Assessment & Plan Note (Addendum)
Acute, recurrent per patient Patient states he has a hx of prostatitis and this feels similar to those instances  UA was negative and pt declined DRE due to concerns for pain Will check CBC, CMP, PSA for signs of acute illness and elevations to corroborate his concerns-results to dictate further management, may need referral to Urology if symptoms persist or recur.  Patient was adamant that this is consistent with previous bouts of prostatitis and is concerned that he will need abx to reduce severity Since it is weekend I will place order for bactrim for 2 weeks with goal of improving symptoms  Recommend follow up for progressing or persistent symptoms

## 2022-09-20 LAB — CBC WITH DIFFERENTIAL/PLATELET
Absolute Monocytes: 307 cells/uL (ref 200–950)
Basophils Absolute: 19 cells/uL (ref 0–200)
Basophils Relative: 0.6 %
Eosinophils Absolute: 40 cells/uL (ref 15–500)
Eosinophils Relative: 1.3 %
HCT: 45 % (ref 38.5–50.0)
Hemoglobin: 15.7 g/dL (ref 13.2–17.1)
Lymphs Abs: 1163 cells/uL (ref 850–3900)
MCH: 32.4 pg (ref 27.0–33.0)
MCHC: 34.9 g/dL (ref 32.0–36.0)
MCV: 92.8 fL (ref 80.0–100.0)
MPV: 10.2 fL (ref 7.5–12.5)
Monocytes Relative: 9.9 %
Neutro Abs: 1572 cells/uL (ref 1500–7800)
Neutrophils Relative %: 50.7 %
Platelets: 140 10*3/uL (ref 140–400)
RBC: 4.85 10*6/uL (ref 4.20–5.80)
RDW: 13.2 % (ref 11.0–15.0)
Total Lymphocyte: 37.5 %
WBC: 3.1 10*3/uL — ABNORMAL LOW (ref 3.8–10.8)

## 2022-09-20 LAB — COMPLETE METABOLIC PANEL WITH GFR
AG Ratio: 2 (calc) (ref 1.0–2.5)
ALT: 48 U/L — ABNORMAL HIGH (ref 9–46)
AST: 30 U/L (ref 10–35)
Albumin: 4.5 g/dL (ref 3.6–5.1)
Alkaline phosphatase (APISO): 111 U/L (ref 35–144)
BUN: 15 mg/dL (ref 7–25)
CO2: 23 mmol/L (ref 20–32)
Calcium: 9.6 mg/dL (ref 8.6–10.3)
Chloride: 107 mmol/L (ref 98–110)
Creat: 0.92 mg/dL (ref 0.70–1.30)
Globulin: 2.2 g/dL (calc) (ref 1.9–3.7)
Glucose, Bld: 110 mg/dL — ABNORMAL HIGH (ref 65–99)
Potassium: 4.2 mmol/L (ref 3.5–5.3)
Sodium: 140 mmol/L (ref 135–146)
Total Bilirubin: 0.8 mg/dL (ref 0.2–1.2)
Total Protein: 6.7 g/dL (ref 6.1–8.1)
eGFR: 97 mL/min/{1.73_m2} (ref >=60)

## 2022-09-20 LAB — PSA: PSA: 0.49 ng/mL (ref <=4.00)

## 2022-09-22 NOTE — Progress Notes (Signed)
Labs are overall normal and stable. CBC does not show evidence of elevated white count and PSA is not elevated. Let us know if you are still having pain or discomfort.

## 2022-10-10 ENCOUNTER — Encounter: Payer: Self-pay | Admitting: Family Medicine

## 2022-10-10 ENCOUNTER — Ambulatory Visit: Payer: Managed Care, Other (non HMO) | Admitting: Family Medicine

## 2022-10-10 VITALS — BP 144/85 | HR 55 | Ht 71.0 in | Wt 362.0 lb

## 2022-10-10 DIAGNOSIS — N410 Acute prostatitis: Secondary | ICD-10-CM

## 2022-10-10 MED ORDER — CIPROFLOXACIN HCL 500 MG PO TABS: 500.0000 mg | ORAL_TABLET | Freq: Two times a day (BID) | ORAL | 1 refills | Status: DC

## 2022-10-10 NOTE — Patient Instructions (Addendum)
Thank you for coming to the office today.  Prostatitis  Prostatitis is swelling or inflammation of the prostate gland, also called the prostate. This gland is about 1.5 inches wide and 1 inch high, and it is involved in making semen. The prostate is located below a man's bladder, in front of the rectum. There are four types of prostatitis: Chronic prostatitis (CP), also called chronic pelvic pain syndrome (CPPS). This is the most common type of prostatitis. It is associated with increased muscle tone in the area between the hip bones (pelvic area), around the prostate. This type is also known as a pelvic floor disorder. Chronic bacterial prostatitis. This type usually results from an acute bacterial infection in the prostate gland that keeps coming back or has not been treated properly. The symptoms are less severe than those caused by acute bacterial prostatitis, which lasts a shorter time. Asymptomatic inflammatory prostatitis. This type does not have symptoms and does not need treatment. This is diagnosed when tests are done for other disorders of the urinary tract or reproductive tract. Acute bacterial prostatitis. This type starts quickly and results from an acute bacterial infection in the prostate gland. It is usually associated with a bladder infection, high fever, and chills. This is the least common type of prostatitis. What are the causes? Bacterial prostatitis is caused by an infection from bacteria. Chronic nonbacterial prostatitis may be caused by: Factors related to the nervous system. This system includes thebrain, spinal cord, and nerves. An autoimmune response. This happens when the body's disease-fighting system attacks healthy tissue in the body by mistake. Psychological factors. These have to do with how the mind works. The causes of the other types of prostatitis are usually not known. What are the signs or symptoms? Symptoms of this condition depend on the type of prostatitis  you have. Acute bacterial prostatitis Symptoms may include: Pain or burning during urination. Frequent and sudden urges to urinate. Trouble starting to urinate. Fever. Chills. Pain in your muscles or joints, lower back, or lower abdomen. Other types of prostatitis Symptoms may include: Sudden urges to urinate, or urinating often. Trouble starting to urinate. Weak urine stream. Dribbling after urination. Discharge coming from the penis. Pain in the testicles, the penis, or the tip of the penis. Pain in the area in front of the rectum and below the scrotum (perineum). Pain when ejaculating. How is this diagnosed? This condition may be diagnosed based on: A physical and medical exam. A digital rectal exam. For this, the health care provider may use a finger to feel the prostate. A urine test to check for bacteria. A semen sample or blood tests. Ultrasound. Urodynamic tests to check how your body handles urine. Cystoscopy to look inside your bladder or inside the part of your body that drains urine from the bladder (urethra). How is this treated? Treatment for this condition depends on the type of prostatitis. Treatment may involve: Medicines to relieve pain or inflammation, or to help relax your muscles. Physical therapy. Heat therapy. Biofeedback. These techniques help you control certain body functions. Relaxation exercises. Antibiotic medicine, if your condition is caused by bacteria. Sitz baths. These warm water baths help to relax your pelvic floor muscles, which helps to relieve pressure on the prostate. Follow these instructions at home: Medicines Take over-the-counter and prescription medicines only as told by your health care provider. If you were prescribed an antibiotic medicine, take it as told by your health care provider. Do not stop using the antibiotic even if  you start to feel better. Managing pain and swelling  Take sitz baths as directed by your health care  provider. For a sitz bath, sit in warm water that is deep enough to cover your hips and buttocks. If directed, apply heat to the affected area as often as told by your health care provider. Use the heat source that your health care provider recommends, such as a moist heat pack or a heating pad. Place a towel between your skin and the heat source. Leave the heat on for 20-30 minutes. Remove the heat if your skin turns bright red. This is especially important if you are unable to feel pain, heat, or cold. You may have a greater risk of getting burned. General instructions Do exercises as told by your health care provider, if you were prescribed physical therapy, biofeedback, or relaxation exercises. Keep all follow-up visits as told by your health care provider. This is important. Where to find more information [employer] of Diabetes and Digestive and Kidney Diseases: http://www.bass.com/ Contact a health care provider if: Your symptoms get worse. You have a fever. Get help right away if: You have chills. You feel light-headed or feel like you may faint. You cannot urinate. You have blood or blood clots in your urine. Summary Prostatitis is swelling or inflammation of the prostate gland. Treatment for this condition depends on the type of prostatitis. Take over-the-counter and prescription medicines only as told by your health care provider. Get help right away of you have chills, feel light-headed, feel like you may faint, cannot urinate, or have blood or blood clots in your urine. This information is not intended to replace advice given to you by your health care provider. Make sure you discuss any questions you have with your health care provider. Document Revised: 01/13/2020 Document Reviewed: 01/13/2020 Elsevier Patient Education  [place].   Please schedule a Follow-up Appointment to: Return if symptoms worsen or fail to improve.  If you have any other  questions or concerns, please feel free to call the office or send a message through [place]. You may also schedule an earlier appointment if necessary.  Additionally, you may be receiving a survey about your experience at our office within a few days to 1 week by e-mail or mail. We value your feedback.  Nobie Putnam, DO [place]

## 2022-10-10 NOTE — Progress Notes (Signed)
Subjective:    Patient ID: Scott Sanford, male    DOB: [birth year], 56 y.o.   MRN: [id number]  Scott Sanford is a 56 y.o. male presenting on 10/10/2022 for prostate infection (/) and Rash   HPI  Prostatitis, chronic Last seen here 09/19/22, was treated  w bactrim and he developed rash on legs, superficial without ulceration, now resolved off antibiotic. In past he had prostatitis and it was treated with Cipro long course Currently still with symptoms in his pelvic region worse with prolonged sitting directly on this area, urinary frequency and urgency, tried NSAID Urine tested at last apt was negative.      09/17/2022    8:57 AM 09/13/2021   10:04 AM 09/03/2020    9:27 AM  Depression screen PHQ 2/9  Decreased Interest 0 0 0  Down, Depressed, Hopeless 0 0 0  PHQ - 2 Score 0 0 0  Altered sleeping 0 0   Tired, decreased energy 0 0   Change in appetite 0 0   Feeling bad or failure about yourself  0 0   Trouble concentrating 0 0   Moving slowly or fidgety/restless 0 0   Suicidal thoughts 0 0   PHQ-9 Score 0 0   Difficult doing work/chores Not difficult at all Not difficult at all     Social History   Tobacco Use   Smoking status: Never   Smokeless tobacco: Never  Vaping Use   Vaping Use: Never used  Substance Use Topics   Alcohol use: Yes    Alcohol/week: 1.0 standard drink of alcohol    Types: 1 Standard drinks or equivalent per week    Comment: occasionally   Drug use: No    Review of Systems Per HPI unless specifically indicated above     Objective:    BP (!) 161/85   Pulse (!) 55   Ht $R'5\' 11"'NE$  (1.803 m)   Wt (!) 362 lb (164.2 kg)   SpO2 99%   BMI 50.49 kg/m   Wt Readings from Last 3 Encounters:  10/10/22 (!) 362 lb (164.2 kg)  09/19/22 (!) 362 lb (164.2 kg)  09/17/22 (!) 362 lb 8 oz (164.4 kg)    Physical Exam Vitals and nursing note reviewed.  Constitutional:      General: He is not in acute distress.    Appearance: Normal appearance. He is  well-developed. He is not diaphoretic.     Comments: Well-appearing, comfortable, cooperative  HENT:     Head: Normocephalic and atraumatic.  Eyes:     General:        Right eye: No discharge.        Left eye: No discharge.     Conjunctiva/sclera: Conjunctivae normal.  Cardiovascular:     Rate and Rhythm: Normal rate.  Pulmonary:     Effort: Pulmonary effort is normal.  Skin:    General: Skin is warm and dry.     Findings: No erythema or rash.  Neurological:     Mental Status: He is alert and oriented to person, place, and time.  Psychiatric:        Mood and Affect: Mood normal.        Behavior: Behavior normal.        Thought Content: Thought content normal.     Comments: Well groomed, good eye contact, normal speech and thoughts    Results for orders placed or performed in visit on 09/19/22  CBC w/Diff/Platelet  Result Value Ref  Range   WBC 3.1 (L) 3.8 - 10.8 Thousand/uL   RBC 4.85 4.20 - 5.80 Million/uL   Hemoglobin 15.7 13.2 - 17.1 g/dL   HCT 45.0 38.5 - 50.0 %   MCV 92.8 80.0 - 100.0 fL   MCH 32.4 27.0 - 33.0 pg   MCHC 34.9 32.0 - 36.0 g/dL   RDW 13.2 11.0 - 15.0 %   Platelets 140 140 - 400 Thousand/uL   MPV 10.2 7.5 - 12.5 fL   Neutro Abs 1,572 1,500 - 7,800 cells/uL   Lymphs Abs 1,163 850 - 3,900 cells/uL   Absolute Monocytes 307 200 - 950 cells/uL   Eosinophils Absolute 40 15 - 500 cells/uL   Basophils Absolute 19 0 - 200 cells/uL   Neutrophils Relative % 50.7 %   Total Lymphocyte 37.5 %   Monocytes Relative 9.9 %   Eosinophils Relative 1.3 %   Basophils Relative 0.6 %  COMPLETE METABOLIC PANEL WITH GFR  Result Value Ref Range   Glucose, Bld 110 (H) 65 - 99 mg/dL   BUN 15 7 - 25 mg/dL   Creat 0.92 0.70 - 1.30 mg/dL   eGFR 97 > OR = 60 mL/min/1.53m2   BUN/Creatinine Ratio SEE NOTE: 6 - 22 (calc)   Sodium 140 135 - 146 mmol/L   Potassium 4.2 3.5 - 5.3 mmol/L   Chloride 107 98 - 110 mmol/L   CO2 23 20 - 32 mmol/L   Calcium 9.6 8.6 - 10.3 mg/dL   Total  Protein 6.7 6.1 - 8.1 g/dL   Albumin 4.5 3.6 - 5.1 g/dL   Globulin 2.2 1.9 - 3.7 g/dL (calc)   AG Ratio 2.0 1.0 - 2.5 (calc)   Total Bilirubin 0.8 0.2 - 1.2 mg/dL   Alkaline phosphatase (APISO) 111 35 - 144 U/L   AST 30 10 - 35 U/L   ALT 48 (H) 9 - 46 U/L  PSA  Result Value Ref Range   PSA 0.49 < OR = 4.00 ng/mL  POCT Urinalysis Dipstick  Result Value Ref Range   Color, UA Yellow    Clarity, UA Clear    Glucose, UA Negative Negative   Bilirubin, UA Negative    Ketones, UA Negative    Spec Grav, UA 1.025 1.010 - 1.025   Blood, UA Negative    pH, UA 6.0 5.0 - 8.0   Protein, UA Negative Negative   Urobilinogen, UA 0.2 0.2 or 1.0 E.U./dL   Nitrite, UA Negative    Leukocytes, UA Negative Negative   Appearance     Odor        Assessment & Plan:   Problem List Items Addressed This Visit     Acute prostatitis - Primary   Relevant Medications   ciprofloxacin (CIPRO) 500 MG tablet    Clinically with acute prostatitis Unresolved from initial course of Bactrim from recent visit - had rash reaction, resolved now Previuosly improved on Cipro in past but required months No current Urologist, had been seen years ago. Re order Cipro course 500 BID x 4-6 weeks - counseling on risk w potential tendon injury side effect If unsuccessful will refer to Urologist  Meds ordered this encounter  Medications   ciprofloxacin (CIPRO) 500 MG tablet    Sig: Take 1 tablet (500 mg total) by mouth 2 (two) times daily. For Prostatitis. For 4 weeks, may refill if need extended course.    Dispense:  60 tablet    Refill:  1      Follow  up plan: Return if symptoms worsen or fail to improve.   Nobie Putnam, [facility] 10/10/2022, 3:34 PM

## 2022-12-24 LAB — COLOGUARD: COLOGUARD: NEGATIVE

## 2023-01-06 ENCOUNTER — Ambulatory Visit
Admission: RE | Admit: 2023-01-06 | Discharge: 2023-01-06 | Disposition: A | Discharge status: Home / Self Care | Payer: Managed Care, Other (non HMO) | Source: Ambulatory Visit | Attending: Physician Assistant | Admitting: Physician Assistant

## 2023-01-06 VITALS — BP 144/82 | HR 76 | Temp 99.2°F | Resp 16 | Ht 71.0 in | Wt 362.0 lb

## 2023-01-06 DIAGNOSIS — U071 COVID-19: Secondary | ICD-10-CM | POA: Diagnosis not present

## 2023-01-06 LAB — RESP PANEL BY RT-PCR (RSV, FLU A&B, COVID)  RVPGX2
Influenza A by PCR: NEGATIVE
Influenza B by PCR: NEGATIVE
Resp Syncytial Virus by PCR: NEGATIVE
SARS Coronavirus 2 by RT PCR: POSITIVE — AB

## 2023-01-06 MED ORDER — PAXLOVID (300/100) 20 X 150 MG & 10 X 100MG PO TBPK: 3.0000 | ORAL_TABLET | Freq: Two times a day (BID) | ORAL | 0 refills | Status: AC

## 2023-01-06 MED ORDER — FLUTICASONE PROPIONATE 50 MCG/ACT NA SUSP: 1.0000 | Freq: Every day | NASAL | 0 refills | Status: DC
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
## 2023-01-06 NOTE — Discharge Instructions (Addendum)
Today you are being treated for COVID-19 which is a virus that will improve with time  Begin Paxlovid every morning and every evening for 5 days, daily you will begin to see improvement of symptoms take the medicine, this medicine reduces the amount of virus in your body but does not fully take away illness    You can take Tylenol and/or Ibuprofen as needed for fever reduction and pain relief.   For cough: honey 1/2 to 1 teaspoon (you can dilute the honey in water or another fluid).  You can also use guaifenesin and dextromethorphan for cough. You can use a humidifier for chest congestion and cough.  If you don't have a humidifier, you can sit in the bathroom with the hot shower running.      For sore throat: try warm salt water gargles, cepacol lozenges, throat spray, warm tea or water with lemon/honey, popsicles or ice, or OTC cold relief medicine for throat discomfort.   For congestion: take a daily anti-histamine like Zyrtec, Claritin, and a oral decongestant, such as pseudoephedrine.  You can also use Flonase 1-2 sprays in each nostril daily.   It is important to stay hydrated: drink plenty of fluids (water, gatorade/powerade/pedialyte, juices, or teas) to keep your throat moisturized and help further relieve irritation/discomfort.

## 2023-01-06 NOTE — ED Triage Notes (Signed)
Pt c/o runny nose, nasal congestion, cough, headache, sore throat. Started about 2 days ago. Denies fever. He states he has 67 58 year old parents at home.

## 2023-04-20 ENCOUNTER — Encounter: Payer: Self-pay | Admitting: Family Medicine

## 2023-04-24 ENCOUNTER — Encounter: Payer: Self-pay | Admitting: Family Medicine

## 2023-04-24 ENCOUNTER — Ambulatory Visit: Payer: Managed Care, Other (non HMO) | Admitting: Family Medicine

## 2023-04-24 DIAGNOSIS — N200 Calculus of kidney: Secondary | ICD-10-CM | POA: Diagnosis not present

## 2023-04-24 MED ORDER — TAMSULOSIN HCL 0.4 MG PO CAPS: 0.4000 mg | ORAL_CAPSULE | Freq: Every day | ORAL | 1 refills | Status: DC | PRN

## 2023-04-24 MED ORDER — KETOROLAC TROMETHAMINE 10 MG PO TABS: 10.0000 mg | ORAL_TABLET | Freq: Four times a day (QID) | ORAL | 1 refills | Status: DC | PRN

## 2023-04-24 NOTE — Patient Instructions (Addendum)
Thank you for coming to the office today.  Refills for Toradol and Tamsulosin ordered, refills added for future if need  Keep up the good work with the weight loss  Upcoming Annual Physical Sept 2024  Please schedule a Follow-up Appointment to: Return if symptoms worsen or fail to improve.  If you have any other questions or concerns, please feel free to call the office or send a message through MyChart. You may also schedule an earlier appointment if necessary.  Additionally, you may be receiving a survey about your experience at our office within a few days to 1 week by e-mail or mail. We value your feedback.  Saralyn Pilar, DO [facility], [name]

## 2023-04-24 NOTE — Progress Notes (Signed)
Subjective:    Patient ID: Scott Sanford, male    DOB: [birth year], 57 y.o.   MRN: [id number]  Scott Sanford is a 57 y.o. male presenting on 04/24/2023 for Flank Pain   HPI  Nephrolithiasis Recently passed kidney stone 04/19/23, he had significant with 5-6 days of darker urine and blood, no pain until Sunday then severe pain. He passed the stone. Improved with Toradol oral course.  Currently no urinary symptoms or pain at this time. Request refills He admits not drinking enough fluid. But has higher protein diet.  Weight Loss He had problems with his lower extremity in January 2024 Due to  Weight loss from 362 lbs down to 337 lbs Goal to get to 280 lbs before can do upcoming surgery for joint replacement      04/24/2023    1:19 PM 09/17/2022    8:57 AM 09/13/2021   10:04 AM  Depression screen PHQ 2/9  Decreased Interest 0 0 0  Down, Depressed, Hopeless 0 0 0  PHQ - 2 Score 0 0 0  Altered sleeping  0 0  Tired, decreased energy  0 0  Change in appetite  0 0  Feeling bad or failure about yourself   0 0  Trouble concentrating  0 0  Moving slowly or fidgety/restless  0 0  Suicidal thoughts  0 0  PHQ-9 Score  0 0  Difficult doing work/chores  Not difficult at all Not difficult at all    Social History   Tobacco Use   Smoking status: Never   Smokeless tobacco: Never  Vaping Use   Vaping Use: Never used  Substance Use Topics   Alcohol use: Yes    Alcohol/week: 1.0 standard drink of alcohol    Types: 1 Standard drinks or equivalent per week    Comment: occasionally   Drug use: No    Review of Systems Per HPI unless specifically indicated above     Objective:    BP 120/78   Pulse (!) 57   Ht 5\' 11"  (1.803 m)   Wt (!) 337 lb (152.9 kg)   SpO2 97%   BMI 47.00 kg/m   Wt Readings from Last 3 Encounters:  04/24/23 (!) 337 lb (152.9 kg)  01/06/23 (!) 361 lb 15.9 oz (164.2 kg)  10/10/22 (!) 362 lb (164.2 kg)    Physical Exam Vitals and nursing note reviewed.   Constitutional:      General: He is not in acute distress.    Appearance: Normal appearance. He is well-developed. He is obese. He is not diaphoretic.     Comments: Well-appearing, comfortable, cooperative  HENT:     Head: Normocephalic and atraumatic.  Eyes:     General:        Right eye: No discharge.        Left eye: No discharge.     Conjunctiva/sclera: Conjunctivae normal.  Cardiovascular:     Rate and Rhythm: Normal rate.  Pulmonary:     Effort: Pulmonary effort is normal.  Skin:    General: Skin is warm and dry.     Findings: No erythema or rash.  Neurological:     Mental Status: He is alert and oriented to person, place, and time.  Psychiatric:        Mood and Affect: Mood normal.        Behavior: Behavior normal.        Thought Content: Thought content normal.     Comments: Well  groomed, good eye contact, normal speech and thoughts    Results for orders placed or performed during the hospital encounter of 01/06/23  Resp panel by RT-PCR (RSV, Flu A&B, Covid) Anterior Nasal Swab   Specimen: Anterior Nasal Swab  Result Value Ref Range   SARS Coronavirus 2 by RT PCR POSITIVE (A) NEGATIVE   Influenza A by PCR NEGATIVE NEGATIVE   Influenza B by PCR NEGATIVE NEGATIVE   Resp Syncytial Virus by PCR NEGATIVE NEGATIVE      Assessment & Plan:   Problem List Items Addressed This Visit   None Visit Diagnoses     Left nephrolithiasis       Relevant Medications   ketorolac (TORADOL) 10 MG tablet   tamsulosin (FLOMAX) 0.4 MG CAPS capsule       Resolved Kidney stone, passed. Asymptomatic now  Discussion on future preparations if future kidney stones  Refills for Toradol and Tamsulosin ordered, refills added for future if need  Keep up the good work with the weight loss  Upcoming Annual Physical Sept 2024  Meds ordered this encounter  Medications   ketorolac (TORADOL) 10 MG tablet    Sig: Take 1 tablet (10 mg total) by mouth every 6 (six) hours as needed for  moderate pain (kidney stone).    Dispense:  20 tablet    Refill:  1   tamsulosin (FLOMAX) 0.4 MG CAPS capsule    Sig: Take 1 capsule (0.4 mg total) by mouth daily as needed (kidney stone). Take up to 7-14 days.    Dispense:  30 capsule    Refill:  1      Follow up plan: Return if symptoms worsen or fail to improve.   Saralyn Pilar, DO [facility] 04/24/2023, 1:35 PM

## 2023-05-17 ENCOUNTER — Other Ambulatory Visit: Payer: Self-pay | Admitting: Family Medicine

## 2023-05-17 DIAGNOSIS — N200 Calculus of kidney: Secondary | ICD-10-CM

## 2023-05-19 NOTE — Telephone Encounter (Signed)
Requested medications are due for refill today.  No  Requested medications are on the active medications list.  yes  Last refill. 04/24/2023 #30 1 rf  Future visit scheduled.   yes  Notes to clinic.  Pt is requesting 90 day supply.  Sig states that pt should take medication for 7-14 days. Please review.    Requested Prescriptions  Pending Prescriptions Disp Refills   tamsulosin (FLOMAX) 0.4 MG CAPS capsule [Pharmacy Med Name: TAMSULOSIN HCL 0.4 MG CAPSULE] 90 capsule 1    Sig: Take 1 capsule (0.4 mg total) by mouth daily as needed (kidney stone). Take up to 7-14 days.     Urology: Alpha-Adrenergic Blocker Passed - 05/17/2023 10:31 AM      Passed - PSA in normal range and within 360 days    PSA  Date Value Ref Range Status  09/19/2022 0.49 < OR = 4.00 ng/mL Final    Comment:    The total PSA value from this assay system is  standardized against the WHO standard. The test  result will be approximately 20% lower when compared  to the equimolar-standardized total PSA (Beckman  Coulter). Comparison of serial PSA results should be  interpreted with this fact in mind. . This test was performed using the Siemens  chemiluminescent method. Values obtained from  different assay methods cannot be used interchangeably. PSA levels, regardless of value, should not be interpreted as absolute evidence of the presence or absence of disease.    Prostate Specific Ag, Serum  Date Value Ref Range Status  08/30/2019 0.9 0.0 - 4.0 ng/mL Final    Comment:    Roche ECLIA methodology. According to the American Urological Association, Serum PSA should decrease and remain at undetectable levels after radical prostatectomy. The AUA defines biochemical recurrence as an initial PSA value 0.2 ng/mL or greater followed by a subsequent confirmatory PSA value 0.2 ng/mL or greater. Values obtained with different assay methods or kits cannot be used interchangeably. Results cannot be interpreted as absolute  evidence of the presence or absence of malignant disease.          Passed - Last BP in normal range    BP Readings from Last 1 Encounters:  04/24/23 120/78         Passed - Valid encounter within last 12 months    Recent Outpatient Visits           3 weeks ago Left nephrolithiasis   [place] [facility] Smitty Cords, DO   7 months ago Acute prostatitis   [place] [facility] Smitty Cords, DO   8 months ago Acute prostatitis   [place] [facility], [name], [name]   8 months ago Annual physical exam   [place] [facility] Smitty Cords, DO   1 year ago Annual physical exam   [place] [facility] Smitty Cords, DO       Future Appointments             In 2 weeks Deirdre Evener, MD [facility]   In 4 months [name], [name], DO [facility] [facility], [facility]

## 2023-06-04 ENCOUNTER — Ambulatory Visit: Payer: Managed Care, Other (non HMO) | Admitting: Dermatology

## 2023-06-04 VITALS — BP 131/75 | HR 54

## 2023-06-04 DIAGNOSIS — L821 Other seborrheic keratosis: Secondary | ICD-10-CM

## 2023-06-04 DIAGNOSIS — Z1283 Encounter for screening for malignant neoplasm of skin: Secondary | ICD-10-CM

## 2023-06-04 DIAGNOSIS — L739 Follicular disorder, unspecified: Secondary | ICD-10-CM | POA: Diagnosis not present

## 2023-06-04 DIAGNOSIS — D1801 Hemangioma of skin and subcutaneous tissue: Secondary | ICD-10-CM

## 2023-06-04 DIAGNOSIS — Z7189 Other specified counseling: Secondary | ICD-10-CM

## 2023-06-04 DIAGNOSIS — L814 Other melanin hyperpigmentation: Secondary | ICD-10-CM

## 2023-06-04 DIAGNOSIS — L719 Rosacea, unspecified: Secondary | ICD-10-CM | POA: Diagnosis not present

## 2023-06-04 DIAGNOSIS — Z79899 Other long term (current) drug therapy: Secondary | ICD-10-CM

## 2023-06-04 DIAGNOSIS — S90229A Contusion of unspecified lesser toe(s) with damage to nail, initial encounter: Secondary | ICD-10-CM

## 2023-06-04 DIAGNOSIS — D229 Melanocytic nevi, unspecified: Secondary | ICD-10-CM

## 2023-06-04 DIAGNOSIS — S90219A Contusion of unspecified great toe with damage to nail, initial encounter: Secondary | ICD-10-CM | POA: Diagnosis not present

## 2023-06-04 DIAGNOSIS — L578 Other skin changes due to chronic exposure to nonionizing radiation: Secondary | ICD-10-CM

## 2023-06-04 DIAGNOSIS — W908XXA Exposure to other nonionizing radiation, initial encounter: Secondary | ICD-10-CM

## 2023-06-04 DIAGNOSIS — X32XXXA Exposure to sunlight, initial encounter: Secondary | ICD-10-CM

## 2023-06-04 MED ORDER — KETOCONAZOLE 2 % EX SHAM: MEDICATED_SHAMPOO | CUTANEOUS | 10 refills | Status: DC

## 2023-06-04 MED ORDER — CLINDAMYCIN PHOSPHATE 1 % EX SOLN: CUTANEOUS | 6 refills | Status: DC

## 2023-06-04 MED ORDER — DOXYCYCLINE HYCLATE 20 MG PO TABS: 20.0000 mg | ORAL_TABLET | Freq: Two times a day (BID) | ORAL | 4 refills | Status: DC

## 2023-06-04 NOTE — Progress Notes (Signed)
Follow-Up Visit   Subjective  Scott Sanford is a 57 y.o. male who presents for the following: Skin Cancer Screening and Full Body Skin Exam Hx of rosacea taking doxy 20 bid, h/o folliculitis  The patient presents for Total-Body Skin Exam (TBSE) for skin cancer screening and mole check. The patient has spots, moles and lesions to be evaluated, some may be new or changing and the patient has concerns that these could be cancer.  The following portions of the chart were reviewed this encounter and updated as appropriate: medications, allergies, medical history  Review of Systems:  No other skin or systemic complaints except as noted in HPI or Assessment and Plan.  Objective  Well appearing patient in no apparent distress; mood and affect are within normal limits.  A full examination was performed including scalp, head, eyes, ears, nose, lips, neck, chest, axillae, abdomen, back, buttocks, bilateral upper extremities, bilateral lower extremities, hands, feet, fingers, toes, fingernails, and toenails. All findings within normal limits unless otherwise noted below.   Relevant physical exam findings are noted in the Assessment and Plan.   Assessment & Plan   Rosacea Head - Anterior (Face)   Rosacea is a chronic progressive skin condition usually affecting the face of adults, causing redness and/or acne bumps. It is treatable but not curable. It sometimes affects the eyes (ocular rosacea) as well. It may respond to topical and/or systemic medication and can flare with stress, sun exposure, alcohol, exercise and some foods.  Daily application of broad spectrum spf 30+ sunscreen to face is recommended to reduce flares.   Discussed the treatment option of BBL/laser.  Typically we recommend 1-3 treatment sessions about 5-8 weeks apart for best results.  The patient's condition may require "maintenance treatments" in the future.  The fee for BBL / laser treatments is $350 per treatment session for  the whole face.  A fee can be quoted for other parts of the body. Insurance typically does not pay for BBL/laser treatments and therefore the fee is an out-of-pocket cost. Chronic and persistent condition with duration or expected duration over one year. Condition is symptomatic/ bothersome to patient. Not currently at goal, but improved. Cont Doxycyline 20mg  1 po bid with food and drink   Doxycycline should be taken with food to prevent nausea. Do not lay down for 30 minutes after taking. Be cautious with sun exposure and use good sun protection while on this medication. Pregnant women should not take this medication.     Folliculitis Scalp [name] Continue taking Doxycyline 20mg  BID with food Start Ketoconazole shampoo 2% apply to scalp two to three times weekly and leave in 7 to 10 mins.  Start Clindamycin 1% solution apply to scalp where crusted areas are.   Doxycycline should be taken with food to prevent nausea. Do not lay down for 30 minutes after taking. Be cautious with sun exposure and use good sun protection while on this medication. Pregnant women should not take this medication.    ketoconazole (NIZORAL) 2 % shampoo - Scalp clindamycin (CLEOCIN T) 1 % external solution - Scalp  Subungual hematoma great toenail Should grow out over time.  Observe.  Photo today. Benign-appearing.  Observation.  Call clinic for new or changing lesions.  Recommend daily use of broad spectrum spf 30+ sunscreen to sun-exposed areas.   LENTIGINES, SEBORRHEIC KERATOSES, HEMANGIOMAS - Benign normal skin lesions - Benign-appearing - Call for any changes  MELANOCYTIC NEVI - Tan-brown and/or pink-flesh-colored symmetric macules and papules -  Benign appearing on exam today - Observation - Call clinic for new or changing moles - Recommend daily use of broad spectrum spf 30+ sunscreen to sun-exposed areas.   ACTINIC DAMAGE - Chronic condition, secondary to cumulative UV/sun exposure - diffuse scaly  erythematous macules with underlying dyspigmentation - Recommend daily broad spectrum sunscreen SPF 30+ to sun-exposed areas, reapply every 2 hours as needed.  - Staying in the shade or wearing long sleeves, sun glasses (UVA+UVB protection) and wide brim hats (4-inch brim around the entire circumference of the hat) are also recommended for sun protection.  - Call for new or changing lesions.  SKIN CANCER SCREENING PERFORMED TODAY.    Rosacea  Related Medications doxycycline (PERIOSTAT) 20 MG tablet Take 1 tablet (20 mg total) by mouth 2 (two) times daily. Take with food and drink  Folliculitis  Related Medications ketoconazole (NIZORAL) 2 % shampoo Start Ketoconazole shampoo 2% apply to scalp two to three times weekly and leave in 7 to 10 mins.  clindamycin (CLEOCIN T) 1 % external solution USED TOPICALLY TO SCALP ON ANY CRUSTED AREA ONCE DAILY AS NEEDED   Return in about 1 year (around 06/03/2024) for TBSE.  IAsher Muir, CMA, am acting as scribe for Armida Sans, MD.   Documentation: I have reviewed the above documentation for accuracy and completeness, and I agree with the above.  Armida Sans, MD

## 2023-06-04 NOTE — Patient Instructions (Addendum)
     Melanoma ABCDEs  Melanoma is the most dangerous type of skin cancer, and is the leading cause of death from skin disease.  You are more likely to develop melanoma if you: Have light-colored skin, light-colored eyes, or red or blond hair Spend a lot of time in the sun Tan regularly, either outdoors or in a tanning bed Have had blistering sunburns, especially during childhood Have a close family member who has had a melanoma Have atypical moles or large birthmarks  Early detection of melanoma is key since treatment is typically straightforward and cure rates are extremely high if we catch it early.   The first sign of melanoma is often a change in a mole or a new dark spot.  The ABCDE system is a way of remembering the signs of melanoma.  A for asymmetry:  The two halves do not match. B for border:  The edges of the growth are irregular. C for color:  A mixture of colors are present instead of an even brown color. D for diameter:  Melanomas are usually (but not always) greater than 6mm - the size of a pencil eraser. E for evolution:  The spot keeps changing in size, shape, and color.  Please check your skin once per month between visits. You can use a small mirror in front and a large mirror behind you to keep an eye on the back side or your body.   If you see any new or changing lesions before your next follow-up, please call to schedule a visit.  Please continue daily skin protection including broad spectrum sunscreen SPF 30+ to sun-exposed areas, reapplying every 2 hours as needed when you're outdoors.   Staying in the shade or wearing long sleeves, sun glasses (UVA+UVB protection) and wide brim hats (4-inch brim around the entire circumference of the hat) are also recommended for sun protection.    Due to recent changes in healthcare laws, you may see results of your pathology and/or laboratory studies on MyChart before the doctors have had a chance to review them. We  understand that in some cases there may be results that are confusing or concerning to you. Please understand that not all results are received at the same time and often the doctors may need to interpret multiple results in order to provide you with the best plan of care or course of treatment. Therefore, we ask that you please give us 2 business days to thoroughly review all your results before contacting the office for clarification. Should we see a critical lab result, you will be contacted sooner.   If You Need Anything After Your Visit  If you have any questions or concerns for your doctor, please call our main line at [phone] and press option 4 to reach your doctor's medical assistant. If no one answers, please leave a voicemail as directed and we will return your call as soon as possible. Messages left after 4 pm will be answered the following business day.   You may also send us a message via MyChart. We typically respond to MyChart messages within 1-2 business days.  For prescription refills, please ask your pharmacy to contact our office. Our fax number is [phone].  If you have an urgent issue when the clinic is closed that cannot wait until the next business day, you can page your doctor at the number below.    Please note that while we do our best to be available for urgent issues   outside of office hours, we are not available 24/7.   If you have an urgent issue and are unable to reach us, you may choose to seek medical care at your doctor's office, retail clinic, urgent care center, or emergency room.  If you have a medical emergency, please immediately call 911 or go to the emergency department.  Pager Numbers  - Dr. Kowalski: [phone]  - Dr. Moye: [phone]  - Dr. Stewart: [phone]  In the event of inclement weather, please call our main line at [phone] for an update on the status of any delays or closures.  Dermatology Medication Tips: Please  keep the boxes that topical medications come in in order to help keep track of the instructions about where and how to use these. Pharmacies typically print the medication instructions only on the boxes and not directly on the medication tubes.   If your medication is too expensive, please contact our office at [phone] option 4 or send us a message through MyChart.   We are unable to tell what your co-pay for medications will be in advance as this is different depending on your insurance coverage. However, we may be able to find a substitute medication at lower cost or fill out paperwork to get insurance to cover a needed medication.   If a prior authorization is required to get your medication covered by your insurance company, please allow us 1-2 business days to complete this process.  Drug prices often vary depending on where the prescription is filled and some pharmacies may offer cheaper prices.  The website www.goodrx.com contains coupons for medications through different pharmacies. The prices here do not account for what the cost may be with help from insurance (it may be cheaper with your insurance), but the website can give you the price if you did not use any insurance.  - You can print the associated coupon and take it with your prescription to the pharmacy.  - You may also stop by our office during regular business hours and pick up a GoodRx coupon card.  - If you need your prescription sent electronically to a different pharmacy, notify our office through [facility] MyChart or by phone at [phone] option 4.     Si Usted Necesita Algo Despus de Su Visita  Tambin puede enviarnos un mensaje a travs de MyChart. Por lo general respondemos a los mensajes de MyChart en el transcurso de 1 a 2 das hbiles.  Para renovar recetas, por favor pida a su farmacia que se ponga en contacto con nuestra oficina. Nuestro nmero de fax es el [phone].  Si tiene un asunto urgente  cuando la clnica est cerrada y que no puede esperar hasta el siguiente da hbil, puede llamar/localizar a su doctor(a) al nmero que aparece a continuacin.   Por favor, tenga en cuenta que aunque hacemos todo lo posible para estar disponibles para asuntos urgentes fuera del horario de oficina, no estamos disponibles las 24 horas del da, los 7 das de la semana.   Si tiene un problema urgente y no puede comunicarse con nosotros, puede optar por buscar atencin mdica  en el consultorio de su doctor(a), en una clnica privada, en un centro de atencin urgente o en una sala de emergencias.  Si tiene una emergencia mdica, por favor llame inmediatamente al 911 o vaya a la sala de emergencias.  Nmeros de bper  - Dr. Kowalski: [phone]  - Dra. Moye: [phone]  - Dra. Stewart: [phone]  En caso   de inclemencias del tiempo, por favor llame a nuestra lnea principal al [phone] para una actualizacin sobre el estado de cualquier retraso o cierre.  Consejos para la medicacin en dermatologa: Por favor, guarde las cajas en las que vienen los medicamentos de uso tpico para ayudarle a seguir las instrucciones sobre dnde y cmo usarlos. Las farmacias generalmente imprimen las instrucciones del medicamento slo en las cajas y no directamente en los tubos del medicamento.   Si su medicamento es muy caro, por favor, pngase en contacto con nuestra oficina llamando al [phone] y presione la opcin 4 o envenos un mensaje a travs de MyChart.   No podemos decirle cul ser su copago por los medicamentos por adelantado ya que esto es diferente dependiendo de la cobertura de su seguro. Sin embargo, es posible que podamos encontrar un medicamento sustituto a menor costo o llenar un formulario para que el seguro cubra el medicamento que se considera necesario.   Si se requiere una autorizacin previa para que su compaa de seguros cubra su medicamento, por favor permtanos de 1 a 2  das hbiles para completar este proceso.  Los precios de los medicamentos varan con frecuencia dependiendo del lugar de dnde se surte la receta y alguna farmacias pueden ofrecer precios ms baratos.  El sitio web www.goodrx.com tiene cupones para medicamentos de diferentes farmacias. Los precios aqu no tienen en cuenta lo que podra costar con la ayuda del seguro (puede ser ms barato con su seguro), pero el sitio web puede darle el precio si no utiliz ningn seguro.  - Puede imprimir el cupn correspondiente y llevarlo con su receta a la farmacia.  - Tambin puede pasar por nuestra oficina durante el horario de atencin regular y recoger una tarjeta de cupones de GoodRx.  - Si necesita que su receta se enve electrnicamente a una farmacia diferente, informe a nuestra oficina a travs de MyChart de [place] o por telfono llamando al [phone] y presione la opcin 4.  

## 2023-06-06 ENCOUNTER — Encounter: Payer: Self-pay | Admitting: Dermatology

## 2023-08-17 ENCOUNTER — Encounter: Payer: Self-pay | Admitting: Dermatology

## 2023-08-17 ENCOUNTER — Ambulatory Visit: Payer: Managed Care, Other (non HMO) | Admitting: Dermatology

## 2023-08-17 VITALS — BP 124/71 | HR 70

## 2023-08-17 DIAGNOSIS — R234 Changes in skin texture: Secondary | ICD-10-CM | POA: Diagnosis not present

## 2023-08-17 DIAGNOSIS — R21 Rash and other nonspecific skin eruption: Secondary | ICD-10-CM

## 2023-08-17 NOTE — Progress Notes (Signed)
   Follow Up Visit   Subjective  Scott Sanford is a 57 y.o. male who presents for the following: Rash - R axilla x 6 weeks, doesn't itch, but it does burn, especially after showering and applying deodorant. Rash did improve with Amoxicillin which he was using for a tooth issue. Patient is currently using TMC 0.05% cream for 5-6 days which has helped some, and he did try Ketoconazole 2% cream for about 3 days but didn't notice an improvement. Both prescriptions are several years old. He has also tried OTC Coritsone psoriasis and Desitin cream. The Desitin cream did help but caused his skin to peel. Patient recently lost 80 lbs and had a similar rash about 14 years ago after losing 70 lbs. Patient has switched to unscented alluminum free deodorant.    The following portions of the chart were reviewed this encounter and updated as appropriate: medications, allergies, medical history  Review of Systems:  No other skin or systemic complaints except as noted in HPI or Assessment and Plan.  Objective  Well appearing patient in no apparent distress; mood and affect are within normal limits.  A focused examination was performed of the following areas: The face and axilla  Relevant exam findings are noted in the Assessment and Plan.    Assessment & Plan  Rash and other nonspecific skin eruption R axillary fold  Steroid atrophy vs erythrasma vs granular parakeratosis vs irritant contact dermatitis  Skin / nail biopsy - R axillary fold Type of biopsy: tangential   Informed consent: discussed and consent obtained   Timeout: patient name, date of birth, surgical site, and procedure verified   Procedure prep:  Patient was prepped and draped in usual sterile fashion Prep type:  Isopropyl alcohol Anesthesia: the lesion was anesthetized in a standard fashion   Anesthetic:  1% lidocaine w/ epinephrine 1-100,000 buffered w/ 8.4% NaHCO3 Instrument used: flexible razor blade   Hemostasis achieved with:  pressure, aluminum chloride and electrodesiccation   Outcome: patient tolerated procedure well   Post-procedure details: sterile dressing applied and wound care instructions given   Dressing type: bandage and petrolatum    Specimen 1 - Surgical pathology Differential Diagnosis: D48.5 rash - r/o steroid atrophy vs erythromelalgia vs granular parakeratosis vs irritant contact dermatitis Check Margins: No   Return for appointment as scheduled.  Maylene Roes, CMA, am acting as scribe for Elie Goody, MD .  Documentation: I have reviewed the above documentation for accuracy and completeness, and I agree with the above.  Elie Goody, MD

## 2023-08-17 NOTE — Patient Instructions (Addendum)

## 2023-08-21 ENCOUNTER — Encounter: Payer: Self-pay | Admitting: Dermatology

## 2023-08-25 ENCOUNTER — Telehealth: Payer: Self-pay

## 2023-08-25 MED ORDER — TRETINOIN 0.025 % EX CREA: TOPICAL_CREAM | CUTANEOUS | 0 refills | Status: DC

## 2023-08-25 NOTE — Telephone Encounter (Signed)
-----   Message from [place] sent at 08/21/2023  3:45 PM EDT ----- Skin , right axillary fold AXILLARY GRANULAR PARAKERATOSIS  Please call to share diagnosis and treatment options. Axillary granular parakeratosis is a benign condition where part of the top layer of skin is retained in the armptis. It's often caused by a deodorant, so recommend avoiding the deodorant used when it developed. Will typically self-resolve. We can prescribe tretinoin 0.025% for nightly use if patient desires. Otherwise, no specific treatment or follow up. Recurrence is possible.

## 2023-08-25 NOTE — Telephone Encounter (Signed)
Called patient. N/A. LMOVM to C/B for pathology results and treatment options.

## 2023-09-16 ENCOUNTER — Other Ambulatory Visit: Payer: Self-pay

## 2023-09-16 DIAGNOSIS — Z0000 Encounter for general adult medical examination without abnormal findings: Secondary | ICD-10-CM

## 2023-09-16 DIAGNOSIS — E782 Mixed hyperlipidemia: Secondary | ICD-10-CM

## 2023-09-16 DIAGNOSIS — R7309 Other abnormal glucose: Secondary | ICD-10-CM

## 2023-09-16 DIAGNOSIS — I10 Essential (primary) hypertension: Secondary | ICD-10-CM

## 2023-09-16 DIAGNOSIS — Z125 Encounter for screening for malignant neoplasm of prostate: Secondary | ICD-10-CM

## 2023-09-17 ENCOUNTER — Other Ambulatory Visit: Payer: Managed Care, Other (non HMO)

## 2023-09-17 LAB — CBC WITH DIFFERENTIAL/PLATELET
Absolute Monocytes: 404 cells/uL (ref 200–950)
Basophils Absolute: 12 cells/uL (ref 0–200)
Basophils Relative: 0.3 %
Eosinophils Absolute: 52 cells/uL (ref 15–500)
Eosinophils Relative: 1.3 %
HCT: 46.4 % (ref 38.5–50.0)
Hemoglobin: 15.2 g/dL (ref 13.2–17.1)
Lymphs Abs: 1468 cells/uL (ref 850–3900)
MCH: 31.9 pg (ref 27.0–33.0)
MCHC: 32.8 g/dL (ref 32.0–36.0)
MCV: 97.5 fL (ref 80.0–100.0)
MPV: 10 fL (ref 7.5–12.5)
Monocytes Relative: 10.1 %
Neutro Abs: 2064 cells/uL (ref 1500–7800)
Neutrophils Relative %: 51.6 %
Platelets: 168 10*3/uL (ref 140–400)
RBC: 4.76 10*6/uL (ref 4.20–5.80)
RDW: 12.8 % (ref 11.0–15.0)
Total Lymphocyte: 36.7 %
WBC: 4 10*3/uL (ref 3.8–10.8)

## 2023-09-18 ENCOUNTER — Encounter: Payer: Managed Care, Other (non HMO) | Admitting: Family Medicine

## 2023-09-18 LAB — PSA: PSA: 0.58 ng/mL (ref <=4.00)

## 2023-09-18 LAB — COMPREHENSIVE METABOLIC PANEL
AG Ratio: 2.4 (calc) (ref 1.0–2.5)
ALT: 22 U/L (ref 9–46)
AST: 21 U/L (ref 10–35)
Albumin: 4.5 g/dL (ref 3.6–5.1)
Alkaline phosphatase (APISO): 114 U/L (ref 35–144)
BUN: 16 mg/dL (ref 7–25)
CO2: 28 mmol/L (ref 20–32)
Calcium: 9.6 mg/dL (ref 8.6–10.3)
Chloride: 108 mmol/L (ref 98–110)
Creat: 0.9 mg/dL (ref 0.70–1.30)
Globulin: 1.9 g/dL (ref 1.9–3.7)
Glucose, Bld: 93 mg/dL (ref 65–99)
Potassium: 4.2 mmol/L (ref 3.5–5.3)
Sodium: 143 mmol/L (ref 135–146)
Total Bilirubin: 0.7 mg/dL (ref 0.2–1.2)
Total Protein: 6.4 g/dL (ref 6.1–8.1)

## 2023-09-18 LAB — TSH: TSH: 1.83 m[IU]/L (ref 0.40–4.50)

## 2023-09-18 LAB — HEMOGLOBIN A1C
Hgb A1c MFr Bld: 5.1 %{Hb} (ref <5.7)
Mean Plasma Glucose: 100 mg/dL
eAG (mmol/L): 5.5 mmol/L

## 2023-09-18 LAB — LIPID PANEL
Cholesterol: 178 mg/dL (ref <200)
HDL: 47 mg/dL (ref >=40)
LDL Cholesterol (Calc): 112 mg/dL — ABNORMAL HIGH
Non-HDL Cholesterol (Calc): 131 mg/dL — ABNORMAL HIGH (ref <130)
Total CHOL/HDL Ratio: 3.8 (calc) (ref <5.0)
Triglycerides: 86 mg/dL (ref <150)

## 2023-09-24 ENCOUNTER — Ambulatory Visit: Payer: Managed Care, Other (non HMO) | Admitting: Family Medicine

## 2023-09-24 ENCOUNTER — Encounter: Payer: Self-pay | Admitting: Family Medicine

## 2023-09-24 VITALS — BP 120/72 | HR 54 | Ht 68.25 in | Wt 295.0 lb

## 2023-09-24 DIAGNOSIS — M1A09X0 Idiopathic chronic gout, multiple sites, without tophus (tophi): Secondary | ICD-10-CM

## 2023-09-24 DIAGNOSIS — Z23 Encounter for immunization: Secondary | ICD-10-CM

## 2023-09-24 DIAGNOSIS — Z0000 Encounter for general adult medical examination without abnormal findings: Secondary | ICD-10-CM

## 2023-09-24 DIAGNOSIS — I10 Essential (primary) hypertension: Secondary | ICD-10-CM | POA: Diagnosis not present

## 2023-09-24 DIAGNOSIS — K219 Gastro-esophageal reflux disease without esophagitis: Secondary | ICD-10-CM | POA: Diagnosis not present

## 2023-09-24 DIAGNOSIS — L209 Atopic dermatitis, unspecified: Secondary | ICD-10-CM

## 2023-09-24 MED ORDER — TELMISARTAN 80 MG PO TABS: 80.0000 mg | ORAL_TABLET | Freq: Every day | ORAL | 3 refills | Status: DC

## 2023-09-24 MED ORDER — ALLOPURINOL 300 MG PO TABS: 300.0000 mg | ORAL_TABLET | Freq: Every day | ORAL | 3 refills | Status: DC

## 2023-09-24 MED ORDER — TRIAMCINOLONE ACETONIDE 0.5 % EX CREA: 1.0000 | TOPICAL_CREAM | Freq: Two times a day (BID) | CUTANEOUS | 2 refills | Status: DC

## 2023-09-24 MED ORDER — OMEPRAZOLE 20 MG PO CPDR: 20.0000 mg | DELAYED_RELEASE_CAPSULE | Freq: Every day | ORAL | 3 refills | Status: DC

## 2023-09-24 MED ORDER — METOPROLOL SUCCINATE ER 50 MG PO TB24: 50.0000 mg | ORAL_TABLET | Freq: Every day | ORAL | 3 refills | Status: DC
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
## 2023-09-24 NOTE — Patient Instructions (Addendum)
Thank you for coming to the office today.  Refilled all medications  Including the Triamincolone cream 0.5%  Keep up the great work weight weight loss.  Coronary Calcium Score Cardiac CT Scan. This is a screening test for patients aged 58-50+ with cardiovascular risk factors or who are healthy but would be interested in Cardiovascular Screening for heart disease. Even if there is a family history of heart disease, this imaging can be useful. Typically it can be done every 5+ years or at a different timeline we agree on  The scan will look at the chest and mainly focus on the heart and identify early signs of calcium build up or blockages within the heart arteries. It is not 100% accurate for identifying blockages or heart disease, but it is useful to help [place] predict who may have some early changes or be at risk in the future for a heart attack or cardiovascular problem.  The results are reviewed by a Cardiologist and they will document the results. It should become available on MyChart. Typically the results are divided into percentiles based on other patients of the same demographic and age. So it will compare your risk to others similar to you. If you have a higher score >99 or higher percentile >75%tile, it is recommended to consider Statin cholesterol therapy and or referral to Cardiologist. I will try to help explain your results and if we have questions we can contact the Cardiologist.  IF ordered - You will be contacted for scheduling. Usually it is done at any imaging facility through [facility], [facility] or [facility].  The cost is $99 flat fee total and it does not go through insurance, so no authorization is required.    Please schedule a Follow-up Appointment to: Return in about 1 year (around 09/23/2024) for 1 year fasting lab only then 1 week later Annual Physical.  If you have any other questions or concerns, please feel free to call the office  or send a message through MyChart. You may also schedule an earlier appointment if necessary.  Additionally, you may be receiving a survey about your experience at our office within a few days to 1 week by e-mail or mail. We value your feedback.  Saralyn Pilar, DO [facility], [name]

## 2023-10-18 ENCOUNTER — Other Ambulatory Visit: Payer: Self-pay | Admitting: Family Medicine

## 2023-10-18 DIAGNOSIS — I10 Essential (primary) hypertension: Secondary | ICD-10-CM

## 2023-10-20 NOTE — Telephone Encounter (Signed)
Resent to [id number].    Requested Prescriptions  Pending Prescriptions Disp Refills   metoprolol succinate (TOPROL-XL) 50 MG 24 hr tablet [Pharmacy Med Name: METOPROLOL SUCC ER 50 MG TAB] 90 tablet 3    Sig: TAKE 1 TABLET BY MOUTH DAILY. TAKE WITH OR IMMEDIATELY FOLLOWING A MEAL.     Cardiovascular:  Beta Blockers Passed - 10/18/2023  9:12 AM      Passed - Last BP in normal range    BP Readings from Last 1 Encounters:  09/24/23 120/72         Passed - Last Heart Rate in normal range    Pulse Readings from Last 1 Encounters:  09/24/23 (!) 54         Passed - Valid encounter within last 6 months    Recent Outpatient Visits           3 weeks ago Annual physical exam   [name], [name], DO   5 months ago Left nephrolithiasis   [name], DO   1 year ago Acute prostatitis   [name], DO   1 year ago Acute prostatitis   [name], [name], [name]   1 year ago Annual physical exam   [name], DO       Future Appointments             In 7 months Deirdre Evener, MD [facility]   In 11 months [name], [name], DO [facility]             telmisartan (MICARDIS) 80 MG tablet [Pharmacy Med Name: TELMISARTAN 80 MG TABLET] 90 tablet 3    Sig: TAKE 1 TABLET BY MOUTH EVERYDAY AT BEDTIME     Cardiovascular:  Angiotensin Receptor Blockers Passed - 10/18/2023  9:12 AM      Passed - Cr in normal range and within 180 days    Creat  Date Value Ref Range Status  09/17/2023 0.90 0.70 - 1.30 mg/dL Final         Passed - K in normal range and within 180 days    Potassium  Date Value Ref Range Status  09/17/2023 4.2 3.5 - 5.3 mmol/L Final         Passed - Patient is not  pregnant      Passed - Last BP in normal range    BP Readings from Last 1 Encounters:  09/24/23 120/72         Passed - Valid encounter within last 6 months    Recent Outpatient Visits           3 weeks ago Annual physical exam   [name], DO   5 months ago Left nephrolithiasis   [name], DO   1 year ago Acute prostatitis   [name], DO   1 year ago Acute prostatitis   [name], [name], [name]   1 year ago Annual physical exam   [name], DO       Future Appointments  In 7 months Deirdre Evener, MD [facility]   In 11 months [name], [name], DO [place] [facility], [facility]

## 2023-11-23 ENCOUNTER — Ambulatory Visit
Admission: RE | Admit: 2023-11-23 | Discharge: 2023-11-23 | Disposition: A | Discharge status: Home / Self Care | Payer: Managed Care, Other (non HMO) | Source: Ambulatory Visit | Attending: Physician Assistant | Admitting: Physician Assistant

## 2023-11-23 ENCOUNTER — Ambulatory Visit: Payer: Managed Care, Other (non HMO)

## 2023-11-23 VITALS — BP 182/102 | HR 50 | Temp 97.7°F | Resp 17

## 2023-11-23 DIAGNOSIS — N200 Calculus of kidney: Secondary | ICD-10-CM | POA: Diagnosis not present

## 2023-11-23 DIAGNOSIS — R109 Unspecified abdominal pain: Secondary | ICD-10-CM | POA: Diagnosis not present

## 2023-11-23 HISTORY — DX: Calculus of kidney: N20.0

## 2023-11-23 LAB — URINALYSIS, W/ REFLEX TO CULTURE (INFECTION SUSPECTED)
Bilirubin Urine: NEGATIVE
Glucose, UA: NEGATIVE mg/dL
Ketones, ur: NEGATIVE mg/dL
Leukocytes,Ua: NEGATIVE
Nitrite: NEGATIVE
Protein, ur: NEGATIVE mg/dL
Specific Gravity, Urine: <1.005 — ABNORMAL LOW (ref 1.005–1.030)
pH: 6 (ref 5.0–8.0)

## 2023-11-23 MED ORDER — KETOROLAC TROMETHAMINE 10 MG PO TABS: 10.0000 mg | ORAL_TABLET | Freq: Four times a day (QID) | ORAL | 0 refills | Status: DC | PRN
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
## 2023-11-23 NOTE — Discharge Instructions (Addendum)
-  You have 3 kidney stones.  I gave you a printout of the radiology report.  These may be too big for you to pass especially the 7 mm 1 and 8 mm 1.  I put a referral into urology for you.  If you have not heard anything in a few days to contact them. - Increase your rest and fluid intake.  Continue the tamsulosin and ketorolac as needed.  Do not take any other NSAIDs including Celebrex while taking this.  You can take Tylenol. - If you are unable to urinate, have fever, worsening pain, blood in urine, intractable vomiting, weakness go to the emergency department as the stone could be obstructing.

## 2023-11-23 NOTE — ED Triage Notes (Signed)
Patient states that he's having right side flank pain sx Wednesday. States that he has hx of kidney stones.

## 2023-11-26 ENCOUNTER — Other Ambulatory Visit: Payer: Self-pay

## 2023-11-26 DIAGNOSIS — N200 Calculus of kidney: Secondary | ICD-10-CM

## 2023-11-27 ENCOUNTER — Ambulatory Visit
Admission: RE | Admit: 2023-11-27 | Discharge: 2023-11-27 | Disposition: A | Discharge status: Home / Self Care | Payer: Managed Care, Other (non HMO) | Source: Ambulatory Visit | Attending: Urology

## 2023-11-27 ENCOUNTER — Telehealth: Payer: Self-pay

## 2023-11-27 ENCOUNTER — Other Ambulatory Visit
Admission: RE | Admit: 2023-11-27 | Discharge: 2023-11-27 | Disposition: A | Discharge status: Home / Self Care | Payer: Managed Care, Other (non HMO) | Source: Home / Self Care | Attending: Urology | Admitting: Urology

## 2023-11-27 ENCOUNTER — Ambulatory Visit: Payer: Managed Care, Other (non HMO) | Admitting: Urology

## 2023-11-27 ENCOUNTER — Other Ambulatory Visit: Payer: Self-pay

## 2023-11-27 ENCOUNTER — Ambulatory Visit
Admission: RE | Admit: 2023-11-27 | Discharge: 2023-11-27 | Disposition: A | Discharge status: Home / Self Care | Payer: Managed Care, Other (non HMO) | Attending: Urology | Admitting: Urology

## 2023-11-27 ENCOUNTER — Encounter: Payer: Self-pay | Admitting: Urology

## 2023-11-27 VITALS — BP 162/80 | HR 65 | Ht 69.0 in | Wt 295.0 lb

## 2023-11-27 DIAGNOSIS — N201 Calculus of ureter: Secondary | ICD-10-CM

## 2023-11-27 DIAGNOSIS — N200 Calculus of kidney: Secondary | ICD-10-CM | POA: Insufficient documentation

## 2023-11-27 LAB — URINALYSIS, COMPLETE (UACMP) WITH MICROSCOPIC
Bilirubin Urine: NEGATIVE
Glucose, UA: NEGATIVE mg/dL
Ketones, ur: NEGATIVE mg/dL
Leukocytes,Ua: NEGATIVE
Nitrite: NEGATIVE
Protein, ur: NEGATIVE mg/dL
Specific Gravity, Urine: 1.01 (ref 1.005–1.030)
Squamous Epithelial / HPF: NONE SEEN /[HPF] (ref 0–5)
pH: 6 (ref 5.0–8.0)
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
## 2023-11-27 NOTE — Progress Notes (Signed)
   [facility] Surgical Posting Form  Surgery Date: Date: 11/30/2023  Surgeon: Dr. [name], MD  Inpt ( No  )   Outpt (Yes)   Obs ( No  )   Diagnosis: N20.1 Right Ureteral Stone  -CPT: [phone]  Surgery: Right Ureteroscopy with Laser Lithotripsy and Stent Placement   Stop Anticoagulations: Yes and also hold ASA  Cardiac/Medical/Pulmonary Clearance needed: no  *Orders entered into EPIC  Date: 11/27/23   *Case booked in [place]  Date: 11/27/23  *Notified pt of Surgery: Date: 11/27/23  PRE-OP UA & CX: no  *Placed into Prior Authorization Work [place] Date: 11/27/23  Assistant/laser/rep:No

## 2023-11-27 NOTE — Telephone Encounter (Signed)
Per Dr. Apolinar Junes, Patient is to be scheduled for  Right Ureteroscopy with Laser Lithotripsy and Stent Placement   Scott Sanford was contacted and possible surgical dates were discussed, Monday December 9th, 2024 was agreed upon for surgery.   Patient was directed to call [phone] between 1-3pm the day before surgery to find out surgical arrival time.  Instructions were given not to eat or drink from midnight on the night before surgery and have a driver for the day of surgery. On the surgery day patient was instructed to [facility] through the [facility] desk.   Pre-Admit Testing will be in contact via phone to set up an interview with the anesthesia team to review your history and medications prior to surgery.   Reminder of this information was sent via MyChart to the patient.

## 2023-11-27 NOTE — Progress Notes (Signed)
Surgical Physician Order Form [facility]  Dr. [name], MD  * Scheduling expectation : Next Available  *Length of Case:   *Clearance needed: no  *Anticoagulation Instructions: Hold all anticoagulants  *Aspirin Instructions: Hold Aspirin  *Post-op visit Date/Instructions:  1 month with RUS prior  *Diagnosis: Right Ureteral Stone  *Procedure: right Ureteroscopy w/laser lithotripsy & stent placement (65784)   Additional orders: N/A  -Admit type: OUTpatient  -Anesthesia: General  -VTE Prophylaxis Standing Order SCD's       Other:   -Standing Lab Orders Per Anesthesia    Lab other: None  -Standing Test orders EKG/Chest x-ray per Anesthesia       Test other:   - Medications:  Ancef 2gm IV  -Other orders:  N/A

## 2023-11-27 NOTE — Patient Instructions (Signed)
Ureteroscopy  Ureteroscopy is a procedure to check for and treat problems inside part of the urinary tract. In this procedure, a long rigid or flexible tube with a lens and light at the end (ureteroscope) is used to look at the inside of the kidneys and the ureters. The ureters are the tubes that carry urine from the kidneys to the bladder. The ureteroscope is inserted into one or both of the ureters. You may need this procedure if you have frequent urinary tract infections (UTIs), blood in your urine, or a stone in one or both of your ureters. A ureteroscopy can be done: To find the cause of urine blockage in a ureter and to evaluate other abnormalities inside the ureters or kidneys. To remove stones. To remove or treat growths of tissue (polyps), abnormal tissue, and some types of tumors. To remove a tissue sample and check it for disease under a microscope (biopsy). Tell a health care provider about: Any allergies you have. All medicines you are taking, including vitamins, herbs, eye drops, creams, and over-the-counter medicines. Any problems you or family members have had with anesthetic medicines. Any bleeding problems you have. Any surgeries you have had. Any medical conditions you have. Whether you are pregnant or may be pregnant. What are the risks? Your health care provider will talk with you about risks. These may include: Abdominal pain or a burning feeling or pain while urinating. Abnormal bleeding. A UTI. Allergic reactions to medicines. Scarring that narrows the ureter (stricture) or swelling. Creating a hole (perforation) in the ureter. Damage to other structures or organs, such as the part of your body that drains urine from your bladder (urethra), your bladder, or your uterus. What happens before the procedure? When to stop eating and drinking  8 hours before your procedure Stop eating most foods. Do not eat meat, fried foods, or fatty foods. Eat only light foods, such  as toast or crackers. All liquids are okay except energy drinks and alcohol. 6 hours before your procedure Stop eating. Drink only clear liquids, such as water, clear fruit juice, black coffee, plain tea, and sports drinks. Do not drink energy drinks or alcohol. 2 hours before your procedure Stop drinking all liquids. You may be allowed to take medicines with small sips of water. Medicines Ask your health care provider about: Changing or stopping your regular medicines. These include any diabetes medicines or blood thinners you take. Taking medicines such as aspirin and ibuprofen. These medicines can thin your blood. Do not take these medicines unless your health care provider tells you to. Taking over-the-counter medicines, vitamins, herbs, and supplements. General instructions Do not use any products that contain nicotine or tobacco for at least 4 weeks before the procedure. These products include cigarettes, chewing tobacco, and vaping devices, such as e-cigarettes. If you need help quitting, ask your health care provider. If you will be going home right after the procedure, plan to have a responsible adult: Take you home from the hospital or clinic. You will not be allowed to drive. Care for you for the time you are told. Ask your health care provider what steps will be taken to help prevent infection. These may include: Washing skin with a soap that kills germs. Receiving antibiotic medicine. Tests You may have an exam or testing. You may have a urine sample taken to check for infection. What happens during the procedure? An IV will be inserted into one of your veins. You may be given: A sedative. This helps   you relax. Anesthesia. This will: Numb certain areas of your body. Make you fall asleep for surgery. Your urethra will be cleaned with a germ-killing solution. The ureteroscope will be passed through your urethra into your bladder. A salt-water solution will be sent through  the ureteroscope to fill your bladder. This will help the health care provider see the openings of your ureters more clearly. The ureteroscope will be passed into your ureter. If a growth is found, a biopsy may be done. If a stone is found, it may be removed through the ureteroscope, or the stone may be broken up using a laser, shock waves, or electrical energy. In some cases, if the ureter is too small, a tube may be inserted that keeps the ureter open (ureteral stent). The stent may be left in place for 1 or 2 weeks, and then the ureteroscopy procedure will be done again. The scope will be removed, and your bladder will be emptied. The procedure may vary among health care providers and hospitals. What happens after the procedure? Your blood pressure, heart rate, breathing rate, and blood oxygen level will be monitored until you leave the hospital or clinic. It is up to you to get the results of your procedure. Ask your health care provider, or the department that is doing the procedure, when your results will be ready. Summary Ureteroscopy is a procedure used to look at the inside of the kidneys and the ureters. You may need this procedure if you have frequent urinary tract infections (UTIs), blood in your urine, or a stone in one or both of your ureters. Follow instructions from your health care provider about eating and drinking. In some cases, if the ureter is too small, a tube may be inserted that keeps the ureter open (ureteral stent). The stent may be left in place for 1 or 2 weeks to keep the ureter open, and then the ureteroscopy procedure will be done again. This information is not intended to replace advice given to you by your health care provider. Make sure you discuss any questions you have with your health care provider. Document Revised: 11/10/2022 Document Reviewed: 11/10/2022 Elsevier Patient Education  2024 Elsevier Inc.  

## 2023-11-28 LAB — URINE CULTURE: Culture: NO GROWTH

## 2023-11-29 MED ORDER — LACTATED RINGERS IV SOLN: INTRAVENOUS | Status: DC

## 2023-11-29 MED ORDER — CEFAZOLIN SODIUM-DEXTROSE 2-4 GM/100ML-% IV SOLN
2.0000 g | INTRAVENOUS | Status: AC
Start: 2023-11-29 — End: 2023-11-30
  Administered 2023-11-30: 2 g via INTRAVENOUS
  Administered 2023-11-30: 3 g via INTRAVENOUS

## 2023-11-29 MED ORDER — ORAL CARE MOUTH RINSE
15.0000 mL | Freq: Once | OROMUCOSAL | Status: AC
Start: 2023-11-29 — End: 2023-11-30

## 2023-11-29 MED ORDER — CHLORHEXIDINE GLUCONATE 0.12 % MT SOLN
15.0000 mL | Freq: Once | OROMUCOSAL | Status: AC
Start: 2023-11-29 — End: 2023-11-30
  Administered 2023-11-30: 15 mL via OROMUCOSAL

## 2023-11-30 ENCOUNTER — Ambulatory Visit: Payer: Managed Care, Other (non HMO) | Admitting: Anesthesiology

## 2023-11-30 ENCOUNTER — Encounter: Payer: Self-pay | Admitting: Urology

## 2023-11-30 ENCOUNTER — Ambulatory Visit: Payer: Managed Care, Other (non HMO)

## 2023-11-30 ENCOUNTER — Encounter
Admission: RE | Disposition: A | Discharge status: Home / Self Care | Payer: Self-pay | Source: Home / Self Care | Attending: Urology

## 2023-11-30 ENCOUNTER — Other Ambulatory Visit: Payer: Self-pay

## 2023-11-30 ENCOUNTER — Ambulatory Visit
Admission: RE | Admit: 2023-11-30 | Discharge: 2023-11-30 | Disposition: A | Discharge status: Home / Self Care | Payer: Managed Care, Other (non HMO) | Attending: Urology | Admitting: Urology

## 2023-11-30 DIAGNOSIS — Z6841 Body Mass Index (BMI) 40.0 and over, adult: Secondary | ICD-10-CM | POA: Diagnosis not present

## 2023-11-30 DIAGNOSIS — K219 Gastro-esophageal reflux disease without esophagitis: Secondary | ICD-10-CM | POA: Insufficient documentation

## 2023-11-30 DIAGNOSIS — N202 Calculus of kidney with calculus of ureter: Secondary | ICD-10-CM | POA: Insufficient documentation

## 2023-11-30 DIAGNOSIS — G4730 Sleep apnea, unspecified: Secondary | ICD-10-CM | POA: Diagnosis not present

## 2023-11-30 DIAGNOSIS — N201 Calculus of ureter: Secondary | ICD-10-CM

## 2023-11-30 DIAGNOSIS — I10 Essential (primary) hypertension: Secondary | ICD-10-CM | POA: Insufficient documentation

## 2023-11-30 DIAGNOSIS — R7309 Other abnormal glucose: Secondary | ICD-10-CM

## 2023-11-30 HISTORY — PX: CYSTOSCOPY/URETEROSCOPY/HOLMIUM LASER/STENT PLACEMENT: SHX6546

## 2023-11-30 SURGERY — CYSTOSCOPY/URETEROSCOPY/HOLMIUM LASER/STENT PLACEMENT
Anesthesia: General | Laterality: Right

## 2023-11-30 MED ORDER — SUGAMMADEX SODIUM 200 MG/2ML IV SOLN
INTRAVENOUS | Status: DC | PRN
  Administered 2023-11-30: 267.6 mg via INTRAVENOUS

## 2023-11-30 MED ORDER — CHLORHEXIDINE GLUCONATE 0.12 % MT SOLN
OROMUCOSAL | Status: AC
  Filled 2023-11-30: qty 15

## 2023-11-30 MED ORDER — ONDANSETRON HCL 4 MG/2ML IJ SOLN
INTRAMUSCULAR | Status: DC | PRN
  Administered 2023-11-30: 4 mg via INTRAVENOUS

## 2023-11-30 MED ORDER — PROPOFOL 10 MG/ML IV BOLUS
INTRAVENOUS | Status: DC | PRN
  Administered 2023-11-30: 200 mg via INTRAVENOUS

## 2023-11-30 MED ORDER — ONDANSETRON HCL 4 MG/2ML IJ SOLN
INTRAMUSCULAR | Status: AC
  Filled 2023-11-30: qty 2

## 2023-11-30 MED ORDER — PROPOFOL 10 MG/ML IV BOLUS
INTRAVENOUS | Status: AC
  Filled 2023-11-30: qty 20

## 2023-11-30 MED ORDER — FENTANYL CITRATE (PF) 100 MCG/2ML IJ SOLN
INTRAMUSCULAR | Status: DC | PRN
  Administered 2023-11-30: 100 ug via INTRAVENOUS

## 2023-11-30 MED ORDER — LIDOCAINE HCL (PF) 2 % IJ SOLN
INTRAMUSCULAR | Status: DC | PRN
  Administered 2023-11-30: 80 mg via INTRADERMAL

## 2023-11-30 MED ORDER — OXYBUTYNIN CHLORIDE 5 MG PO TABS: 5.0000 mg | ORAL_TABLET | Freq: Three times a day (TID) | ORAL | 0 refills | Status: DC | PRN

## 2023-11-30 MED ORDER — LIDOCAINE HCL (PF) 2 % IJ SOLN
INTRAMUSCULAR | Status: AC
  Filled 2023-11-30: qty 5

## 2023-11-30 MED ORDER — MIDAZOLAM HCL 2 MG/2ML IJ SOLN
INTRAMUSCULAR | Status: AC
  Filled 2023-11-30: qty 2

## 2023-11-30 MED ORDER — PROPOFOL 1000 MG/100ML IV EMUL
INTRAVENOUS | Status: AC
  Filled 2023-11-30: qty 100

## 2023-11-30 MED ORDER — ROCURONIUM BROMIDE 100 MG/10ML IV SOLN
INTRAVENOUS | Status: DC | PRN
  Administered 2023-11-30: 50 mg via INTRAVENOUS

## 2023-11-30 MED ORDER — SODIUM CHLORIDE 0.9 % IR SOLN
Status: DC | PRN
  Administered 2023-11-30: 1500 mL

## 2023-11-30 MED ORDER — CEFAZOLIN SODIUM-DEXTROSE 2-4 GM/100ML-% IV SOLN
INTRAVENOUS | Status: AC
  Filled 2023-11-30: qty 100

## 2023-11-30 MED ORDER — DEXAMETHASONE SODIUM PHOSPHATE 10 MG/ML IJ SOLN
INTRAMUSCULAR | Status: AC
  Filled 2023-11-30: qty 1

## 2023-11-30 MED ORDER — DROPERIDOL 2.5 MG/ML IJ SOLN: 0.6250 mg | Freq: Once | INTRAMUSCULAR | Status: DC | PRN

## 2023-11-30 MED ORDER — ROCURONIUM BROMIDE 10 MG/ML (PF) SYRINGE
PREFILLED_SYRINGE | INTRAVENOUS | Status: AC
  Filled 2023-11-30: qty 10

## 2023-11-30 MED ORDER — MIDAZOLAM HCL 2 MG/2ML IJ SOLN
INTRAMUSCULAR | Status: DC | PRN
  Administered 2023-11-30: 2 mg via INTRAVENOUS

## 2023-11-30 MED ORDER — DEXAMETHASONE SODIUM PHOSPHATE 10 MG/ML IJ SOLN
INTRAMUSCULAR | Status: DC | PRN
  Administered 2023-11-30: 10 mg via INTRAVENOUS

## 2023-11-30 MED ORDER — FENTANYL CITRATE (PF) 100 MCG/2ML IJ SOLN
INTRAMUSCULAR | Status: AC
  Filled 2023-11-30: qty 2

## 2023-11-30 MED ORDER — IOHEXOL 180 MG/ML  SOLN
INTRAMUSCULAR | Status: DC | PRN
  Administered 2023-11-30: 20 mL

## 2023-11-30 MED ORDER — FENTANYL CITRATE (PF) 100 MCG/2ML IJ SOLN: 25.0000 ug | INTRAMUSCULAR | Status: DC | PRN

## 2023-11-30 SURGICAL SUPPLY — 24 items
ADHESIVE MASTISOL STRL (MISCELLANEOUS) IMPLANT
BAG DRAIN SIEMENS DORNER NS (MISCELLANEOUS) ×1 IMPLANT
BASKET ZERO TIP 1.9FR (BASKET) IMPLANT
BRUSH SCRUB EZ 1% IODOPHOR (MISCELLANEOUS) IMPLANT
CATH URET FLEX-TIP 2 LUMEN 10F (CATHETERS) IMPLANT
CATH URETL OPEN 5X70 (CATHETERS) ×1 IMPLANT
CNTNR URN SCR LID CUP LEK RST (MISCELLANEOUS) IMPLANT
DRAPE UTILITY 15X26 TOWEL STRL (DRAPES) IMPLANT
DRSG TEGADERM 2-3/8X2-3/4 SM (GAUZE/BANDAGES/DRESSINGS) IMPLANT
FIBER LASER MOSES 200 DFL (Laser) IMPLANT
GLOVE BIO SURGEON STRL SZ 6.5 (GLOVE) ×1 IMPLANT
GOWN STRL REUS W/ TWL LRG LVL3 (GOWN DISPOSABLE) ×2 IMPLANT
GUIDEWIRE GREEN .038 145CM (MISCELLANEOUS) IMPLANT
GUIDEWIRE STR DUAL SENSOR (WIRE) ×1 IMPLANT
IV NS IRRIG 3000ML ARTHROMATIC (IV SOLUTION) ×1 IMPLANT
KIT TURNOVER CYSTO (KITS) ×1 IMPLANT
PACK CYSTO AR (MISCELLANEOUS) ×1 IMPLANT
SET CYSTO W/LG BORE CLAMP LF (SET/KITS/TRAYS/PACK) ×1 IMPLANT
SHEATH NAVIGATOR HD 12/14X36 (SHEATH) IMPLANT
SHEATH NAVIGATOR HD 12/14X46 (SHEATH) IMPLANT
STENT URET 6FRX24 CONTOUR (STENTS) IMPLANT
STENT URET 6FRX26 CONTOUR (STENTS) IMPLANT
SURGILUBE 2OZ TUBE FLIPTOP (MISCELLANEOUS) ×1 IMPLANT
WATER STERILE IRR 500ML POUR (IV SOLUTION) ×1 IMPLANT

## 2023-11-30 NOTE — Anesthesia Procedure Notes (Signed)
Procedure Name: Intubation Date/Time: 11/30/2023 12:49 PM  Performed by: Emeterio Reeve, CRNAPre-anesthesia Checklist: Patient identified, Emergency Drugs available, Suction available and Patient being monitored Patient Re-evaluated:Patient Re-evaluated prior to induction Oxygen Delivery Method: Circle system utilized Preoxygenation: Pre-oxygenation with 100% oxygen Induction Type: IV induction Ventilation: Mask ventilation without difficulty Laryngoscope Size: McGrath and 4 Grade View: Grade I Tube type: Oral Tube size: 7.0 mm Number of attempts: 1 Airway Equipment and Method: Stylet and Oral airway Placement Confirmation: ETT inserted through vocal cords under direct vision, positive ETCO2 and breath sounds checked- equal and bilateral Secured at: 23 cm Tube secured with: Tape Dental Injury: Teeth and Oropharynx as per pre-operative assessment  Comments: Cords clear; no trauma. CA
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
## 2023-11-30 NOTE — Transfer of Care (Signed)
Immediate Anesthesia Transfer of Care Note  Patient: Scott Sanford  Procedure(s) Performed: CYSTOSCOPY/URETEROSCOPY/HOLMIUM LASER/STENT PLACEMENT (Right)  Patient Location: PACU  Anesthesia Type:General  Level of Consciousness: drowsy and patient cooperative  Airway & Oxygen Therapy: Patient Spontanous Breathing and Patient connected to face mask oxygen  Post-op Assessment: Report given to RN and Post -op Vital signs reviewed and stable  Post vital signs: stable  Last Vitals:  Vitals Value Taken Time  BP    Temp    Pulse 63 11/30/23 1350  Resp 10 11/30/23 1350  SpO2 100 % 11/30/23 1350  Vitals shown include unfiled device data.  Last Pain:  Vitals:   11/30/23 1100  PainSc: 0-No pain         Complications: No notable events documented.

## 2023-11-30 NOTE — Discharge Instructions (Signed)
You have a ureteral stent in place.  This is a tube that extends from your kidney to your bladder.  This may cause urinary bleeding, burning with urination, and urinary frequency.  Please call our office or present to the ED if you develop fevers >101 or pain which is not able to be controlled with oral pain medications.  You may be given either Flomax and/ or ditropan to help with bladder spasms and stent pain in addition to pain medications.    [facility] [address], [address] [place], [place] [address] [phone] 

## 2023-11-30 NOTE — Interval H&P Note (Signed)
History and Physical Interval Note:  11/30/2023 12:17 PM  Scott Sanford  has presented today for surgery, with the diagnosis of Right Ureteral Stone.  The various methods of treatment have been discussed with the patient and family. After consideration of risks, benefits and other options for treatment, the patient has consented to  Procedure(s): CYSTOSCOPY/URETEROSCOPY/HOLMIUM LASER/STENT PLACEMENT (Right) as a surgical intervention.  The patient's history has been reviewed, patient examined, no change in status, stable for surgery.  I have reviewed the patient's chart and labs.  Questions were answered to the patient's satisfaction.    [facility]  [name]

## 2023-11-30 NOTE — Op Note (Signed)
Date of procedure: 11/30/23  Preoperative diagnosis:  Right kidney and ureteral stones  Postoperative diagnosis:  Same as above  Procedure: Right ureteroscopy Laser lithotripsy Right ureteral stent placement Retrograde pyelogram Interpretation of fluoroscopy less than 30 minutes  Surgeon: [name], MD  Anesthesia: General  Complications: None  Intraoperative findings: Calcification within the mid and proximal ureter consistent with ureteral calculi.  More distal calcification lateral to the ureter, likely phlebolith.  Additional nonobstructing stone burden treated.  Stent left without tether.  EBL: Minimal  Specimens: Stone fragment  Drains: 6 x 24 French double-J ureteral stent on right  Indication: Scott Sanford is a 57 y.o. patient with multiple probable ureteral calculi and flank pain presents today for management of his stone burden.  After reviewing the management options for treatment,   elected to proceed with the above surgical procedure(s). We have discussed the potential benefits and risks of the procedure, side effects of the proposed treatment, the likelihood of the patient achieving the goals of the procedure, and any potential problems that might occur during the procedure or recuperation. Informed consent has been obtained.  Description of procedure:  The patient was taken to the operating room and general anesthesia was induced.  The patient was placed in the dorsal lithotomy position, prepped and draped in the usual sterile fashion, and preoperative antibiotics were administered. A preoperative time-out was performed.    A 21 French scope was advanced per urethra into the bladder.  Attention was turned to the right ureteral orifice.  On scout imaging, there were at least 3 calcifications felt to be possibly within the ureter.  The right UO was intubated using a 5 [place] open-ended ureteral catheter and a gentle retrograde pyelogram did show that at least 2  of these the mid and proximal calcifications were consistent with ureteral stones.  This was then cannulated using a sensor wire up to the level of the kidney.  Next, I attempted to advance the 6 French long semirigid ureteroscope unsuccessfully.  A 4.5 semirigid ureteroscope was able to reach the mid ureter where the stone was encountered, however this was the short scope and the stone was just beyond its reach.  In order to get there, I did have to use a railroad technique with a Super Stiff wire.  A Super Stiff wire was then introduced all the way up to the level of the kidney.  The sensor wire was snapped in place as a safety wire.  A ureteral access sheath, 12/14 [place] was advanced  just proximal to the ureteral stone.  A dual-lumen digital ureteroscope was then advanced up to the level of the stone.  A 240 m laser fiber was then brought in using dusting settings of 0.3 J and 80 Hz, the stone was dusted.  All fragments were removed.  The scope was then advanced into the each of the calyces and additional stone burden was identified and dusted.  At the end of the procedure, no significant residual stone fragment remained greater than the size of the tip of the laser fiber.  A final retrograde pyelogram created roadmap to ensure that each every calyx was visualized and all significant stone burden was addressed.  There was no contrast extravasation.  The scope was then backed down the length of the ureter inspecting the ureteral integrity along the way.  There was no residual stone burden or significant ureteral injuries appreciated.  Finally, a 6 by 24French ureteral access sheath was advanced over the safety  wire up to the level of the kidney.  Upon wire removal, there was a full coil noted both within the renal pelvis as well as within the bladder.   The patient was then cleaned and dried, repositioned in supine position, reversed of anesthesia, taken to the PACU in stable condition.  Plan: He will return  to the office next week for cystoscopy, stent removal.  [name], M.D.

## 2023-12-01 ENCOUNTER — Encounter: Payer: Self-pay | Admitting: Urology

## 2023-12-03 ENCOUNTER — Encounter: Payer: Self-pay | Admitting: Urology

## 2023-12-04 LAB — CALCULI, WITH PHOTOGRAPH (CLINICAL LAB)
Calcium Oxalate Monohydrate: 100 %
Weight Calculi: 10 mg

## 2023-12-08 ENCOUNTER — Ambulatory Visit (INDEPENDENT_AMBULATORY_CARE_PROVIDER_SITE_OTHER): Payer: Managed Care, Other (non HMO) | Admitting: Urology

## 2023-12-08 VITALS — BP 126/75 | HR 61

## 2023-12-08 DIAGNOSIS — Z792 Long term (current) use of antibiotics: Secondary | ICD-10-CM | POA: Diagnosis not present

## 2023-12-08 DIAGNOSIS — Z466 Encounter for fitting and adjustment of urinary device: Secondary | ICD-10-CM

## 2023-12-08 DIAGNOSIS — Z960 Presence of urogenital implants: Secondary | ICD-10-CM

## 2023-12-08 LAB — URINALYSIS, COMPLETE
Bilirubin, UA: NEGATIVE
Glucose, UA: NEGATIVE
Ketones, UA: NEGATIVE
Nitrite, UA: NEGATIVE
Specific Gravity, UA: 1.015 (ref 1.005–1.030)
Urobilinogen, Ur: 0.2 mg/dL (ref 0.2–1.0)
pH, UA: 5.5 (ref 5.0–7.5)

## 2023-12-08 LAB — MICROSCOPIC EXAMINATION: RBC, Urine: >30 /[HPF] — AB (ref 0–2)

## 2023-12-08 MED ORDER — CEPHALEXIN 250 MG PO CAPS
500.0000 mg | ORAL_CAPSULE | Freq: Once | ORAL | Status: AC
  Administered 2023-12-08: 500 mg via ORAL

## 2023-12-09 NOTE — Progress Notes (Signed)
   12/08/23  CC:  Chief Complaint  Patient presents with   Cysto Stent Removal    HPI: 57 year old male with a personal history of right ureteral calculi status post ureteroscopy presents today for cystoscopy, stent removal.  He was given a dose of periprocedural antibiotics today.  Blood pressure 126/75, pulse 61. NED. A&Ox3.   No respiratory distress   Abd soft, NT, ND Normal phallus with bilateral descended testicles  Cystoscopy/ Stent removal procedure  Patient identification was confirmed, informed consent was obtained, and patient was prepped using Betadine solution.  Lidocaine jelly was administered per urethral meatus.    Preoperative abx where received prior to procedure.    Procedure: - Flexible cystoscope introduced, without any difficulty.   - Thorough search of the bladder revealed:    normal urethral meatus  Stent seen emanating from right ureteral orifice, grasped with stent graspers, and removed in entirety.    Post-Procedure: - Patient tolerated the procedure well  Assessment/ Plan:  1. Prophylactic antibiotic (Primary) - Urinalysis, Complete - cephALEXin (KEFLEX) capsule 500 mg  2. S/P ureteral stent placement Stent removed today without difficulty  Discussed postprocedural precautions  Return in 4 weeks with renal ultrasound prior - Urinalysis, Complete - cephALEXin (KEFLEX) capsule 500 mg   [name], MD

## 2023-12-10 NOTE — Anesthesia Postprocedure Evaluation (Signed)
Anesthesia Post Note  Patient: Scott Sanford  Procedure(s) Performed: CYSTOSCOPY/URETEROSCOPY/HOLMIUM LASER/STENT PLACEMENT (Right)  Patient location during evaluation: PACU Anesthesia Type: General Level of consciousness: awake and alert Pain management: pain level controlled Vital Signs Assessment: post-procedure vital signs reviewed and stable Respiratory status: spontaneous breathing, nonlabored ventilation, respiratory function stable and patient connected to nasal cannula oxygen Cardiovascular status: blood pressure returned to baseline and stable Postop Assessment: no apparent nausea or vomiting Anesthetic complications: no   No notable events documented.   Last Vitals:  Vitals:   11/30/23 1445 11/30/23 1446  BP: (!) 164/90 (!) 156/74  Pulse: (!) 57 (!) 57  Resp: 15 10  Temp: (!) 36.1 C   SpO2: 100% 100%    Last Pain:  Vitals:   11/30/23 1446  TempSrc:   PainSc: 0-No pain                 Lenard Simmer

## 2024-01-18 ENCOUNTER — Other Ambulatory Visit: Payer: Self-pay

## 2024-01-18 DIAGNOSIS — Z960 Presence of urogenital implants: Secondary | ICD-10-CM

## 2024-01-18 DIAGNOSIS — N200 Calculus of kidney: Secondary | ICD-10-CM

## 2024-01-20 ENCOUNTER — Ambulatory Visit
Admission: RE | Admit: 2024-01-20 | Discharge: 2024-01-20 | Disposition: A | Discharge status: Home / Self Care | Payer: Managed Care, Other (non HMO) | Source: Ambulatory Visit | Attending: Urology | Admitting: Urology

## 2024-01-20 DIAGNOSIS — N200 Calculus of kidney: Secondary | ICD-10-CM | POA: Insufficient documentation

## 2024-01-20 DIAGNOSIS — Z960 Presence of urogenital implants: Secondary | ICD-10-CM | POA: Insufficient documentation

## 2024-01-26 ENCOUNTER — Ambulatory Visit: Payer: Self-pay | Admitting: Urology

## 2024-02-16 ENCOUNTER — Ambulatory Visit: Payer: Managed Care, Other (non HMO) | Admitting: Urology

## 2024-02-16 VITALS — BP 180/83 | HR 61

## 2024-02-16 DIAGNOSIS — Z09 Encounter for follow-up examination after completed treatment for conditions other than malignant neoplasm: Secondary | ICD-10-CM

## 2024-02-16 DIAGNOSIS — Z87442 Personal history of urinary calculi: Secondary | ICD-10-CM | POA: Diagnosis not present

## 2024-02-16 DIAGNOSIS — N200 Calculus of kidney: Secondary | ICD-10-CM
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
## 2024-06-08 ENCOUNTER — Other Ambulatory Visit: Payer: Self-pay | Admitting: Dermatology

## 2024-06-08 DIAGNOSIS — L739 Follicular disorder, unspecified: Secondary | ICD-10-CM

## 2024-06-09 ENCOUNTER — Ambulatory Visit: Payer: Managed Care, Other (non HMO) | Admitting: Dermatology

## 2024-06-21 ENCOUNTER — Ambulatory Visit: Admitting: Dermatology

## 2024-06-21 ENCOUNTER — Encounter: Payer: Self-pay | Admitting: Dermatology

## 2024-06-21 DIAGNOSIS — L814 Other melanin hyperpigmentation: Secondary | ICD-10-CM

## 2024-06-21 DIAGNOSIS — J3489 Other specified disorders of nose and nasal sinuses: Secondary | ICD-10-CM

## 2024-06-21 DIAGNOSIS — Z7189 Other specified counseling: Secondary | ICD-10-CM

## 2024-06-21 DIAGNOSIS — L739 Follicular disorder, unspecified: Secondary | ICD-10-CM

## 2024-06-21 DIAGNOSIS — L820 Inflamed seborrheic keratosis: Secondary | ICD-10-CM

## 2024-06-21 DIAGNOSIS — Z1283 Encounter for screening for malignant neoplasm of skin: Secondary | ICD-10-CM | POA: Diagnosis not present

## 2024-06-21 DIAGNOSIS — W908XXA Exposure to other nonionizing radiation, initial encounter: Secondary | ICD-10-CM

## 2024-06-21 DIAGNOSIS — Z79899 Other long term (current) drug therapy: Secondary | ICD-10-CM

## 2024-06-21 DIAGNOSIS — L578 Other skin changes due to chronic exposure to nonionizing radiation: Secondary | ICD-10-CM

## 2024-06-21 DIAGNOSIS — Z792 Long term (current) use of antibiotics: Secondary | ICD-10-CM

## 2024-06-21 DIAGNOSIS — L821 Other seborrheic keratosis: Secondary | ICD-10-CM

## 2024-06-21 DIAGNOSIS — D1801 Hemangioma of skin and subcutaneous tissue: Secondary | ICD-10-CM

## 2024-06-21 DIAGNOSIS — L719 Rosacea, unspecified: Secondary | ICD-10-CM

## 2024-06-21 DIAGNOSIS — D229 Melanocytic nevi, unspecified: Secondary | ICD-10-CM

## 2024-06-21 MED ORDER — CLINDAMYCIN PHOSPHATE 1 % EX SOLN: CUTANEOUS | 11 refills | Status: AC

## 2024-06-21 MED ORDER — DOXYCYCLINE HYCLATE 20 MG PO TABS
20.0000 mg | ORAL_TABLET | Freq: Two times a day (BID) | ORAL | 11 refills | Status: AC
Start: 2024-06-21 — End: 2024-07-21

## 2024-06-21 MED ORDER — KETOCONAZOLE 2 % EX SHAM: 1.0000 | MEDICATED_SHAMPOO | Freq: Once | CUTANEOUS | 11 refills | Status: AC

## 2024-06-21 NOTE — Patient Instructions (Addendum)

## 2024-06-21 NOTE — Progress Notes (Signed)
 Follow-Up Visit   Subjective  Scott Sanford is a 58 y.o. male who presents for the following: Skin Cancer Screening and Full Body Skin Exam Patient report growth on his nose that started in 2006 when he started wearing a CPAP mask, this area will come and go but never goes completely away.   The patient presents for Total-Body Skin Exam (TBSE) for skin cancer screening and mole check. The patient has spots, moles and lesions to be evaluated, some may be new or changing and the patient may have concern these could be cancer.  The following portions of the chart were reviewed this encounter and updated as appropriate: medications, allergies, medical history  Review of Systems:  No other skin or systemic complaints except as noted in HPI or Assessment and Plan.  Objective  Well appearing patient in no apparent distress; mood and affect are within normal limits.  A full examination was performed including scalp, head, eyes, ears, nose, lips, neck, chest, axillae, abdomen, back, buttocks, bilateral upper extremities, bilateral lower extremities, hands, feet, fingers, toes, fingernails, and toenails. All findings within normal limits unless otherwise noted below.   Relevant physical exam findings are noted in the Assessment and Plan.  left thigh Stuck-on, waxy, tan-brown papule or plaque --Discussed benign etiology and prognosis.  right mid dorsum nose Ulcer      Assessment & Plan   SKIN CANCER SCREENING PERFORMED TODAY.  ACTINIC DAMAGE - Chronic condition, secondary to cumulative UV/sun exposure - diffuse scaly erythematous macules with underlying dyspigmentation - Recommend daily broad spectrum sunscreen SPF 30+ to sun-exposed areas, reapply every 2 hours as needed.  - Staying in the shade or wearing long sleeves, sun glasses (UVA+UVB protection) and wide brim hats (4-inch brim around the entire circumference of the hat) are also recommended for sun protection.  - Call for new or  changing lesions.  LENTIGINES, SEBORRHEIC KERATOSES, HEMANGIOMAS - Benign normal skin lesions - Benign-appearing - Call for any changes  MELANOCYTIC NEVI - Tan-brown and/or pink-flesh-colored symmetric macules and papules - Benign appearing on exam today - Observation - Call clinic for new or changing moles - Recommend daily use of broad spectrum spf 30+ sunscreen to sun-exposed areas.   Folliculitis Scalp /Rosacea Chronic and persistent condition with duration or expected duration over one year. Condition is improving with treatment but not currently at goal. Continue taking Doxycyline 20mg  BID with food Continue  Ketoconazole  shampoo 2% apply to scalp two to three times weekly and leave in 7 to 10 mins.  Continue  Clindamycin  1% solution apply to scalp where crusted areas are.   Doxycycline  should be taken with food to prevent nausea. Do not lay down for 30 minutes after taking. Be cautious with sun exposure and use good sun protection while on this medication. Pregnant women should not take this medication.  Long term medication management.  Patient is using long term (months to years) prescription medication  to control their dermatologic condition.  These medications require periodic monitoring to evaluate for efficacy and side effects and may require periodic laboratory monitoring.\ FOLLICULITIS   Related Medications clindamycin  (CLEOCIN  T) 1 % external solution USE TOPICALLY TO SCALP ON ANY CRUSTED AREA ONCE DAILY AS NEEDED INFLAMED SEBORRHEIC KERATOSIS left thigh Symptomatic, irritating, patient would like treated.  Destruction of lesion - left thigh Complexity: simple   Destruction method: cryotherapy   Informed consent: discussed and consent obtained   Timeout:  patient name, date of birth, surgical site, and procedure verified Lesion  destroyed using liquid nitrogen: Yes   Region frozen until ice ball extended beyond lesion: Yes   Outcome: patient tolerated procedure  well with no complications   Post-procedure details: wound care instructions given   SORE NOSE right mid dorsum nose Pt states he has had a pressure injury periodically on his nose in same place since 2006 from CPAP mask pressure.  It often heals for many months and looks normal, then will break out again from CPAP pressure. Due to patient history suspect pressure sore from CPAP mask vs skin Cancer. Area examined by Dr Hester and Dr Claudene in the office today- with Dermoscopy. Dr Hester and Dr Claudene agree that there is currently no evidence by exam that this is consistent with skin cancer and is consistent with pressure / traumatic sore from CPAP friction and pressure.  See photo  Recheck at next office visit. Will plan biopsy in future if changes. Discussed with pt and counseled.  ACTINIC SKIN DAMAGE   LENTIGO   MELANOCYTIC NEVUS, UNSPECIFIED LOCATION   SKIN CANCER SCREENING   ROSACEA   COUNSELING AND COORDINATION OF CARE   MEDICATION MANAGEMENT   Return in about 1 year (around 06/21/2025) for TBSE, hx of Rosacea .  IFay Kirks, CMA, am acting as scribe for Alm Hester, MD .   Documentation: I have reviewed the above documentation for accuracy and completeness, and I agree with the above.  Alm Hester, MD

## 2024-07-21 ENCOUNTER — Other Ambulatory Visit: Payer: Self-pay | Admitting: Dermatology

## 2024-07-21 DIAGNOSIS — L719 Rosacea, unspecified: Secondary | ICD-10-CM

## 2024-09-22 ENCOUNTER — Other Ambulatory Visit: Payer: Self-pay | Admitting: Family Medicine

## 2024-09-22 DIAGNOSIS — Z0000 Encounter for general adult medical examination without abnormal findings: Secondary | ICD-10-CM

## 2024-09-22 DIAGNOSIS — M1A09X0 Idiopathic chronic gout, multiple sites, without tophus (tophi): Secondary | ICD-10-CM

## 2024-09-22 DIAGNOSIS — Z125 Encounter for screening for malignant neoplasm of prostate: Secondary | ICD-10-CM

## 2024-09-22 DIAGNOSIS — E782 Mixed hyperlipidemia: Secondary | ICD-10-CM

## 2024-09-22 DIAGNOSIS — R7309 Other abnormal glucose: Secondary | ICD-10-CM

## 2024-09-22 DIAGNOSIS — I10 Essential (primary) hypertension: Secondary | ICD-10-CM

## 2024-09-23 ENCOUNTER — Other Ambulatory Visit: Payer: Self-pay

## 2024-09-24 LAB — CBC WITH DIFFERENTIAL/PLATELET
Absolute Lymphocytes: 1421 {cells}/uL (ref 850–3900)
Absolute Monocytes: 314 {cells}/uL (ref 200–950)
Basophils Absolute: 19 {cells}/uL (ref 0–200)
Basophils Relative: 0.6 %
Eosinophils Absolute: 42 {cells}/uL (ref 15–500)
Eosinophils Relative: 1.3 %
HCT: 43.6 % (ref 38.5–50.0)
Hemoglobin: 14.8 g/dL (ref 13.2–17.1)
MCH: 32.1 pg (ref 27.0–33.0)
MCHC: 33.9 g/dL (ref 32.0–36.0)
MCV: 94.6 fL (ref 80.0–100.0)
MPV: 9.9 fL (ref 7.5–12.5)
Monocytes Relative: 9.8 %
Neutro Abs: 1405 {cells}/uL — ABNORMAL LOW (ref 1500–7800)
Neutrophils Relative %: 43.9 %
Platelets: 150 Thousand/uL (ref 140–400)
RBC: 4.61 Million/uL (ref 4.20–5.80)
RDW: 13.1 % (ref 11.0–15.0)
Total Lymphocyte: 44.4 %
WBC: 3.2 Thousand/uL — ABNORMAL LOW (ref 3.8–10.8)

## 2024-09-24 LAB — LIPID PANEL
Cholesterol: 166 mg/dL (ref <200)
HDL: 47 mg/dL (ref >=40)
LDL Cholesterol (Calc): 103 mg/dL — ABNORMAL HIGH
Non-HDL Cholesterol (Calc): 119 mg/dL (ref <130)
Total CHOL/HDL Ratio: 3.5 (calc) (ref <5.0)
Triglycerides: 72 mg/dL (ref <150)

## 2024-09-24 LAB — COMPREHENSIVE METABOLIC PANEL WITH GFR
AG Ratio: 2.4 (calc) (ref 1.0–2.5)
ALT: 20 U/L (ref 9–46)
AST: 20 U/L (ref 10–35)
Albumin: 4.4 g/dL (ref 3.6–5.1)
Alkaline phosphatase (APISO): 93 U/L (ref 35–144)
BUN: 16 mg/dL (ref 7–25)
CO2: 26 mmol/L (ref 20–32)
Calcium: 9.4 mg/dL (ref 8.6–10.3)
Chloride: 106 mmol/L (ref 98–110)
Creat: 0.79 mg/dL (ref 0.70–1.30)
Globulin: 1.8 g/dL — ABNORMAL LOW (ref 1.9–3.7)
Glucose, Bld: 84 mg/dL (ref 65–99)
Potassium: 3.9 mmol/L (ref 3.5–5.3)
Sodium: 141 mmol/L (ref 135–146)
Total Bilirubin: 1.2 mg/dL (ref 0.2–1.2)
Total Protein: 6.2 g/dL (ref 6.1–8.1)
eGFR: 102 mL/min/1.73m2 (ref >=60)

## 2024-09-24 LAB — URIC ACID: Uric Acid, Serum: 3.9 mg/dL — ABNORMAL LOW (ref 4.0–8.0)

## 2024-09-24 LAB — HEMOGLOBIN A1C
Hgb A1c MFr Bld: 5.1 % (ref <5.7)
Mean Plasma Glucose: 100 mg/dL
eAG (mmol/L): 5.5 mmol/L

## 2024-09-24 LAB — PSA: PSA: 0.61 ng/mL (ref <=4.00)

## 2024-09-24 LAB — TSH: TSH: 0.99 m[IU]/L (ref 0.40–4.50)

## 2024-10-03 ENCOUNTER — Other Ambulatory Visit: Payer: Self-pay | Admitting: Family Medicine

## 2024-10-03 ENCOUNTER — Encounter: Payer: Self-pay | Admitting: Family Medicine

## 2024-10-03 ENCOUNTER — Ambulatory Visit: Payer: Self-pay | Admitting: Family Medicine

## 2024-10-03 VITALS — BP 132/72 | HR 65 | Ht 69.0 in | Wt 295.0 lb

## 2024-10-03 DIAGNOSIS — R7309 Other abnormal glucose: Secondary | ICD-10-CM

## 2024-10-03 DIAGNOSIS — M1A09X0 Idiopathic chronic gout, multiple sites, without tophus (tophi): Secondary | ICD-10-CM | POA: Diagnosis not present

## 2024-10-03 DIAGNOSIS — Z0000 Encounter for general adult medical examination without abnormal findings: Secondary | ICD-10-CM | POA: Diagnosis not present

## 2024-10-03 DIAGNOSIS — Z125 Encounter for screening for malignant neoplasm of prostate: Secondary | ICD-10-CM

## 2024-10-03 DIAGNOSIS — E782 Mixed hyperlipidemia: Secondary | ICD-10-CM

## 2024-10-03 DIAGNOSIS — Z6841 Body Mass Index (BMI) 40.0 and over, adult: Secondary | ICD-10-CM

## 2024-10-03 DIAGNOSIS — I10 Essential (primary) hypertension: Secondary | ICD-10-CM | POA: Diagnosis not present

## 2024-10-03 DIAGNOSIS — Z23 Encounter for immunization: Secondary | ICD-10-CM | POA: Diagnosis not present

## 2024-10-03 DIAGNOSIS — N200 Calculus of kidney: Secondary | ICD-10-CM

## 2024-10-03 DIAGNOSIS — G4733 Obstructive sleep apnea (adult) (pediatric): Secondary | ICD-10-CM

## 2024-10-03 DIAGNOSIS — K219 Gastro-esophageal reflux disease without esophagitis: Secondary | ICD-10-CM

## 2024-10-03 MED ORDER — ALLOPURINOL 300 MG PO TABS: 300.0000 mg | ORAL_TABLET | Freq: Every evening | ORAL | 3 refills | Status: AC

## 2024-10-03 MED ORDER — OMEPRAZOLE 20 MG PO CPDR: 20.0000 mg | DELAYED_RELEASE_CAPSULE | Freq: Every day | ORAL | 3 refills | Status: AC

## 2024-10-03 MED ORDER — METOPROLOL SUCCINATE ER 50 MG PO TB24: 50.0000 mg | ORAL_TABLET | Freq: Every evening | ORAL | 3 refills | Status: AC

## 2024-10-03 MED ORDER — TELMISARTAN 80 MG PO TABS: 80.0000 mg | ORAL_TABLET | Freq: Every day | ORAL | 3 refills | Status: AC
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
## 2024-10-03 NOTE — Patient Instructions (Addendum)
 Thank you for coming to the office today.  Keep on current diet for weight management.  TO DO LIST   TDap tetanus vaccine  Prevnar-20 pneumonia vaccine  FUTURE CONSIDERATION Coronary Calcium  Score Cardiac CT Scan. This is a screening test for patients aged 59-50+ with cardiovascular risk factors or who are healthy but would be interested in Cardiovascular Screening for heart disease. Even if there is a family history of heart disease, this imaging can be useful. Typically it can be done every 5+ years or at a different timeline we agree on  The scan will look at the chest and mainly focus on the heart and identify early signs of calcium  build up or blockages within the heart arteries. It is not 100% accurate for identifying blockages or heart disease, but it is useful to help us  predict who may have some early changes or be at risk in the future for a heart attack or cardiovascular problem.  The results are reviewed by a Cardiologist and they will document the results. It should become available on MyChart. Typically the results are divided into percentiles based on other patients of the same demographic and age. So it will compare your risk to others similar to you. If you have a higher score >99 or higher percentile >75%tile, it is recommended to consider Statin cholesterol therapy and or referral to Cardiologist. I will try to help explain your results and if we have questions we can contact the Cardiologist.  You will be contacted for scheduling. Usually it is done at any imaging facility through [facility], [facility] or [facility].  The cost is $99 flat fee total and it does not go through insurance, so no authorization is required.    Please schedule a Follow-up Appointment to: Return for 1 year fasting lab > 1 week later Annual Physical.  If you have any other questions or concerns, please feel free to call the office or send a message through  MyChart. You may also schedule an earlier appointment if necessary.  Additionally, you may be receiving a survey about your experience at our office within a few days to 1 week by e-mail or mail. We value your feedback.  Marsa Officer, DO [facility], [name]

## 2025-02-09 ENCOUNTER — Ambulatory Visit: Payer: Managed Care, Other (non HMO) | Admitting: Urology

## 2025-06-21 ENCOUNTER — Ambulatory Visit: Admitting: Dermatology

## 2025-09-27 ENCOUNTER — Other Ambulatory Visit

## 2025-10-04 ENCOUNTER — Encounter: Admitting: Family Medicine
